# Patient Record
Sex: Male | Born: 1965 | Race: White | Hispanic: No | Marital: Married | Smoking: Current every day smoker
Health system: Southern US, Community
[De-identification: ages and names within clinical notes are randomized; demographics above are authoritative.]

## PROBLEM LIST (undated history)

## (undated) ENCOUNTER — Ambulatory Visit: Admission: EM

## (undated) DIAGNOSIS — F419 Anxiety disorder, unspecified: Secondary | ICD-10-CM

## (undated) DIAGNOSIS — G8929 Other chronic pain: Secondary | ICD-10-CM

## (undated) DIAGNOSIS — M549 Dorsalgia, unspecified: Secondary | ICD-10-CM

## (undated) DIAGNOSIS — K429 Umbilical hernia without obstruction or gangrene: Secondary | ICD-10-CM

## (undated) DIAGNOSIS — I10 Essential (primary) hypertension: Secondary | ICD-10-CM

## (undated) HISTORY — PX: HERNIA REPAIR: SHX51

## (undated) HISTORY — PX: BACK SURGERY: SHX140

---

## 2003-08-13 ENCOUNTER — Other Ambulatory Visit: Payer: Self-pay

## 2004-03-21 ENCOUNTER — Other Ambulatory Visit: Payer: Self-pay

## 2004-09-25 ENCOUNTER — Emergency Department: Payer: Self-pay | Admitting: Emergency Medicine

## 2004-10-07 ENCOUNTER — Emergency Department: Payer: Self-pay | Admitting: Emergency Medicine

## 2005-04-21 ENCOUNTER — Emergency Department: Payer: Self-pay | Admitting: Emergency Medicine

## 2005-04-21 ENCOUNTER — Other Ambulatory Visit: Payer: Self-pay

## 2005-09-02 ENCOUNTER — Other Ambulatory Visit: Payer: Self-pay

## 2005-09-02 ENCOUNTER — Emergency Department: Payer: Self-pay | Admitting: Emergency Medicine

## 2009-01-12 ENCOUNTER — Emergency Department: Payer: Self-pay

## 2009-01-17 ENCOUNTER — Ambulatory Visit: Payer: Self-pay | Admitting: Unknown Physician Specialty

## 2009-02-07 ENCOUNTER — Ambulatory Visit: Payer: Self-pay | Admitting: Unknown Physician Specialty

## 2009-11-09 ENCOUNTER — Ambulatory Visit: Payer: Self-pay | Admitting: Family Medicine

## 2010-01-29 ENCOUNTER — Ambulatory Visit: Payer: Self-pay | Admitting: Internal Medicine

## 2010-03-14 ENCOUNTER — Emergency Department: Payer: Self-pay | Admitting: Unknown Physician Specialty

## 2010-05-07 ENCOUNTER — Ambulatory Visit: Payer: Self-pay | Admitting: Internal Medicine

## 2012-02-11 ENCOUNTER — Ambulatory Visit: Payer: Self-pay | Admitting: Internal Medicine

## 2012-02-14 LAB — BETA STREP CULTURE(ARMC)

## 2012-05-01 ENCOUNTER — Emergency Department: Payer: Self-pay | Admitting: Emergency Medicine

## 2012-12-13 ENCOUNTER — Ambulatory Visit: Payer: Self-pay | Admitting: Physician Assistant

## 2013-04-07 ENCOUNTER — Other Ambulatory Visit: Payer: Self-pay | Admitting: Orthopedic Surgery

## 2013-04-07 DIAGNOSIS — M545 Low back pain: Secondary | ICD-10-CM

## 2013-04-14 ENCOUNTER — Other Ambulatory Visit: Payer: Self-pay

## 2013-04-14 ENCOUNTER — Ambulatory Visit
Admission: RE | Admit: 2013-04-14 | Discharge: 2013-04-14 | Disposition: A | Discharge status: Home / Self Care | Payer: 59 | Source: Ambulatory Visit | Attending: Orthopedic Surgery | Admitting: Orthopedic Surgery

## 2013-04-14 DIAGNOSIS — M545 Low back pain: Secondary | ICD-10-CM

## 2013-04-24 ENCOUNTER — Ambulatory Visit
Admission: RE | Admit: 2013-04-24 | Discharge: 2013-04-24 | Disposition: A | Discharge status: Home / Self Care | Payer: 59 | Source: Ambulatory Visit | Attending: Orthopedic Surgery | Admitting: Orthopedic Surgery

## 2013-05-02 ENCOUNTER — Emergency Department: Payer: Self-pay | Admitting: Emergency Medicine

## 2013-05-21 ENCOUNTER — Emergency Department: Payer: Self-pay | Admitting: Emergency Medicine

## 2013-05-21 LAB — COMPREHENSIVE METABOLIC PANEL
Albumin: 3.8 g/dL (ref 3.4–5.0)
Alkaline Phosphatase: 81 U/L (ref 50–136)
Anion Gap: 5 — ABNORMAL LOW (ref 7–16)
BUN: 10 mg/dL (ref 7–18)
Bilirubin,Total: 0.5 mg/dL (ref 0.2–1.0)
Calcium, Total: 9.2 mg/dL (ref 8.5–10.1)
Chloride: 105 mmol/L (ref 98–107)
Co2: 27 mmol/L (ref 21–32)
Creatinine: 0.94 mg/dL (ref 0.60–1.30)
EGFR (African American): >60
EGFR (Non-African Amer.): >60
Glucose: 100 mg/dL — ABNORMAL HIGH (ref 65–99)
Osmolality: 273 (ref 275–301)
Potassium: 4.3 mmol/L (ref 3.5–5.1)
SGOT(AST): 33 U/L (ref 15–37)
SGPT (ALT): 40 U/L (ref 12–78)
Sodium: 137 mmol/L (ref 136–145)
Total Protein: 7.6 g/dL (ref 6.4–8.2)

## 2013-05-21 LAB — CBC
MCH: 32.5 pg (ref 26.0–34.0)
MCHC: 34 g/dL (ref 32.0–36.0)
MCV: 96 fL (ref 80–100)
RBC: 4.76 10*6/uL (ref 4.40–5.90)
RDW: 13.3 % (ref 11.5–14.5)
WBC: 8.4 10*3/uL (ref 3.8–10.6)

## 2013-05-21 LAB — TROPONIN I
Troponin-I: <0.02 ng/mL
Troponin-I: <0.02 ng/mL

## 2013-05-31 ENCOUNTER — Ambulatory Visit: Payer: Self-pay | Admitting: Neurosurgery

## 2013-06-02 ENCOUNTER — Other Ambulatory Visit: Payer: Self-pay | Admitting: Neurosurgery

## 2013-07-07 ENCOUNTER — Encounter (HOSPITAL_COMMUNITY): Payer: Self-pay | Admitting: Pharmacy Technician

## 2013-07-09 ENCOUNTER — Encounter (HOSPITAL_COMMUNITY)
Admission: RE | Admit: 2013-07-09 | Discharge: 2013-07-09 | Disposition: A | Discharge status: Home / Self Care | Payer: 59 | Source: Ambulatory Visit | Attending: Neurosurgery | Admitting: Neurosurgery

## 2013-07-09 ENCOUNTER — Encounter (HOSPITAL_COMMUNITY): Payer: Self-pay

## 2013-07-09 ENCOUNTER — Other Ambulatory Visit (HOSPITAL_COMMUNITY): Payer: Self-pay | Admitting: *Deleted

## 2013-07-09 DIAGNOSIS — Z01818 Encounter for other preprocedural examination: Secondary | ICD-10-CM | POA: Insufficient documentation

## 2013-07-09 DIAGNOSIS — Z01812 Encounter for preprocedural laboratory examination: Secondary | ICD-10-CM | POA: Insufficient documentation

## 2013-07-09 HISTORY — DX: Umbilical hernia without obstruction or gangrene: K42.9

## 2013-07-09 HISTORY — DX: Essential (primary) hypertension: I10

## 2013-07-09 HISTORY — DX: Anxiety disorder, unspecified: F41.9

## 2013-07-09 LAB — CBC
HCT: 47.2 % (ref 39.0–52.0)
Hemoglobin: 15.9 g/dL (ref 13.0–17.0)
MCV: 95.7 fL (ref 78.0–100.0)
RBC: 4.93 MIL/uL (ref 4.22–5.81)
RDW: 12.9 % (ref 11.5–15.5)
WBC: 7.7 10*3/uL (ref 4.0–10.5)

## 2013-07-09 LAB — COMPREHENSIVE METABOLIC PANEL
Albumin: 3.7 g/dL (ref 3.5–5.2)
Alkaline Phosphatase: 74 U/L (ref 39–117)
BUN: 8 mg/dL (ref 6–23)
CO2: 26 mEq/L (ref 19–32)
Chloride: 103 mEq/L (ref 96–112)
GFR calc non Af Amer: >90 mL/min (ref >90)
Potassium: 4.5 mEq/L (ref 3.5–5.1)
Sodium: 139 mEq/L (ref 135–145)
Total Bilirubin: 0.2 mg/dL — ABNORMAL LOW (ref 0.3–1.2)

## 2013-07-09 LAB — SURGICAL PCR SCREEN: MRSA, PCR: NEGATIVE

## 2013-07-09 NOTE — Pre-Procedure Instructions (Signed)
Tristan Thompson [place]  07/09/2013   Your procedure is scheduled on:  Friday, July 16, 2013 at 7:30 AM.   Report to [facility] Entrance "A" at 5:30 AM.   Call this number if you have problems the morning of surgery: [phone]   Remember:   Do not eat food or drink liquids after midnight Thursday, 07/15/13.   Take these medicines the morning of surgery with A SIP OF WATER: pain med   Do not wear jewelry.  Do not wear lotions, powders, or cologne. You may wear deodorant.             Men may shave face and neck.  Do not bring valuables to the hospital.  [facility] is not responsible                  for any belongings or valuables.               Contacts, dentures or bridgework may not be worn into surgery.  Leave suitcase in the car. After surgery it may be brought to your room.  For patients admitted to the hospital, discharge time is determined by your                treatment team.                Special Instructions: Shower using CHG 2 nights before surgery and the night before surgery.  If you shower the day of surgery use CHG.  Use special wash - you have one bottle of CHG for all showers.  You should use approximately 1/3 of the bottle for each shower.   Please read over the following fact sheets that you were given: Pain Booklet, Coughing and Deep Breathing, MRSA Information and Surgical Site Infection Prevention

## 2013-07-15 MED ORDER — CEFAZOLIN SODIUM-DEXTROSE 2-3 GM-% IV SOLR
2.0000 g | INTRAVENOUS | Status: AC
  Administered 2013-07-16: 2 g via INTRAVENOUS
  Filled 2013-07-15: qty 50

## 2013-07-16 ENCOUNTER — Encounter (HOSPITAL_COMMUNITY)
Admission: RE | Disposition: A | Discharge status: Home / Self Care | Payer: Self-pay | Source: Ambulatory Visit | Attending: Neurosurgery

## 2013-07-16 ENCOUNTER — Encounter (HOSPITAL_COMMUNITY): Payer: 59 | Admitting: Anesthesiology

## 2013-07-16 ENCOUNTER — Inpatient Hospital Stay (HOSPITAL_COMMUNITY): Payer: 59 | Admitting: Anesthesiology

## 2013-07-16 ENCOUNTER — Encounter (HOSPITAL_COMMUNITY): Payer: Self-pay | Admitting: Surgery

## 2013-07-16 ENCOUNTER — Inpatient Hospital Stay (HOSPITAL_COMMUNITY)
Admission: RE | Admit: 2013-07-16 | Discharge: 2013-07-17 | DRG: 472 | Disposition: A | Discharge status: Home / Self Care | Payer: 59 | Source: Ambulatory Visit | Attending: Neurosurgery | Admitting: Neurosurgery

## 2013-07-16 ENCOUNTER — Inpatient Hospital Stay (HOSPITAL_COMMUNITY): Payer: 59

## 2013-07-16 DIAGNOSIS — Z79899 Other long term (current) drug therapy: Secondary | ICD-10-CM

## 2013-07-16 DIAGNOSIS — M5000 Cervical disc disorder with myelopathy, unspecified cervical region: Principal | ICD-10-CM | POA: Diagnosis present

## 2013-07-16 DIAGNOSIS — I10 Essential (primary) hypertension: Secondary | ICD-10-CM | POA: Diagnosis present

## 2013-07-16 DIAGNOSIS — M4712 Other spondylosis with myelopathy, cervical region: Secondary | ICD-10-CM | POA: Diagnosis present

## 2013-07-16 DIAGNOSIS — F411 Generalized anxiety disorder: Secondary | ICD-10-CM | POA: Diagnosis present

## 2013-07-16 DIAGNOSIS — F17200 Nicotine dependence, unspecified, uncomplicated: Secondary | ICD-10-CM | POA: Diagnosis present

## 2013-07-16 HISTORY — PX: ANTERIOR CERVICAL DECOMP/DISCECTOMY FUSION: SHX1161

## 2013-07-16 SURGERY — ANTERIOR CERVICAL DECOMPRESSION/DISCECTOMY FUSION 3 LEVELS
Anesthesia: General | Wound class: Clean

## 2013-07-16 MED ORDER — SODIUM CHLORIDE 0.9 % IJ SOLN
3.0000 mL | Freq: Two times a day (BID) | INTRAMUSCULAR | Status: DC
  Administered 2013-07-16 (×2): 3 mL via INTRAVENOUS

## 2013-07-16 MED ORDER — DEXAMETHASONE SODIUM PHOSPHATE 4 MG/ML IJ SOLN
4.0000 mg | Freq: Four times a day (QID) | INTRAMUSCULAR | Status: AC
  Administered 2013-07-16: 4 mg via INTRAVENOUS
  Filled 2013-07-16: qty 1

## 2013-07-16 MED ORDER — LIDOCAINE HCL (CARDIAC) 20 MG/ML IV SOLN
INTRAVENOUS | Status: DC | PRN
  Administered 2013-07-16: 100 mg via INTRAVENOUS

## 2013-07-16 MED ORDER — HYDROMORPHONE HCL PF 1 MG/ML IJ SOLN
INTRAMUSCULAR | Status: AC
  Filled 2013-07-16: qty 1

## 2013-07-16 MED ORDER — FENTANYL CITRATE 0.05 MG/ML IJ SOLN
INTRAMUSCULAR | Status: DC | PRN
  Administered 2013-07-16 (×3): 50 ug via INTRAVENOUS
  Administered 2013-07-16: 150 ug via INTRAVENOUS
  Administered 2013-07-16: 50 ug via INTRAVENOUS

## 2013-07-16 MED ORDER — ACETAMINOPHEN 325 MG PO TABS: 650.0000 mg | ORAL_TABLET | ORAL | Status: DC | PRN

## 2013-07-16 MED ORDER — MENTHOL 3 MG MT LOZG: 1.0000 | LOZENGE | OROMUCOSAL | Status: DC | PRN

## 2013-07-16 MED ORDER — CEFAZOLIN SODIUM-DEXTROSE 2-3 GM-% IV SOLR
2.0000 g | Freq: Three times a day (TID) | INTRAVENOUS | Status: AC
  Administered 2013-07-16 (×2): 2 g via INTRAVENOUS
  Filled 2013-07-16 (×2): qty 50

## 2013-07-16 MED ORDER — CYCLOBENZAPRINE HCL 10 MG PO TABS
ORAL_TABLET | ORAL | Status: AC
  Filled 2013-07-16: qty 1

## 2013-07-16 MED ORDER — OXYCODONE HCL 5 MG PO TABS
ORAL_TABLET | ORAL | Status: AC
  Filled 2013-07-16: qty 1

## 2013-07-16 MED ORDER — PANTOPRAZOLE SODIUM 40 MG IV SOLR
40.0000 mg | Freq: Every day | INTRAVENOUS | Status: DC
  Administered 2013-07-16: 40 mg via INTRAVENOUS
  Filled 2013-07-16 (×2): qty 40

## 2013-07-16 MED ORDER — ONDANSETRON HCL 4 MG/2ML IJ SOLN
INTRAMUSCULAR | Status: DC | PRN
  Administered 2013-07-16: 4 mg via INTRAVENOUS

## 2013-07-16 MED ORDER — HYDROMORPHONE HCL PF 1 MG/ML IJ SOLN
1.0000 mg | INTRAMUSCULAR | Status: DC | PRN
  Administered 2013-07-16: 1.5 mg via INTRAMUSCULAR
  Filled 2013-07-16: qty 2

## 2013-07-16 MED ORDER — SODIUM CHLORIDE 0.9 % IR SOLN
Status: DC | PRN
  Administered 2013-07-16: 08:00:00

## 2013-07-16 MED ORDER — OXYCODONE HCL 5 MG PO TABS
5.0000 mg | ORAL_TABLET | Freq: Once | ORAL | Status: AC | PRN
Start: 2013-07-16 — End: 2013-07-16
  Administered 2013-07-16: 5 mg via ORAL

## 2013-07-16 MED ORDER — ROCURONIUM BROMIDE 100 MG/10ML IV SOLN
INTRAVENOUS | Status: DC | PRN
  Administered 2013-07-16: 20 mg via INTRAVENOUS
  Administered 2013-07-16: 50 mg via INTRAVENOUS
  Administered 2013-07-16: 10 mg via INTRAVENOUS
  Administered 2013-07-16 (×2): 20 mg via INTRAVENOUS

## 2013-07-16 MED ORDER — ACETAMINOPHEN 650 MG RE SUPP: 650.0000 mg | RECTAL | Status: DC | PRN

## 2013-07-16 MED ORDER — SODIUM CHLORIDE 0.9 % IJ SOLN: 3.0000 mL | INTRAMUSCULAR | Status: DC | PRN

## 2013-07-16 MED ORDER — PHENOL 1.4 % MT LIQD: 1.0000 | OROMUCOSAL | Status: DC | PRN

## 2013-07-16 MED ORDER — HYDROCODONE-ACETAMINOPHEN 5-325 MG PO TABS
1.0000 | ORAL_TABLET | ORAL | Status: DC | PRN
  Administered 2013-07-16 – 2013-07-17 (×4): 2 via ORAL
  Filled 2013-07-16 (×4): qty 2

## 2013-07-16 MED ORDER — CYCLOBENZAPRINE HCL 10 MG PO TABS
10.0000 mg | ORAL_TABLET | Freq: Three times a day (TID) | ORAL | Status: DC | PRN
  Administered 2013-07-16 – 2013-07-17 (×3): 10 mg via ORAL
  Filled 2013-07-16 (×3): qty 1

## 2013-07-16 MED ORDER — KCL IN DEXTROSE-NACL 20-5-0.45 MEQ/L-%-% IV SOLN
80.0000 mL/h | INTRAVENOUS | Status: DC
  Filled 2013-07-16 (×3): qty 1000

## 2013-07-16 MED ORDER — OXYCODONE HCL 5 MG/5ML PO SOLN: 5.0000 mg | Freq: Once | ORAL | Status: AC | PRN

## 2013-07-16 MED ORDER — LACTATED RINGERS IV SOLN
INTRAVENOUS | Status: DC | PRN
  Administered 2013-07-16 (×3): via INTRAVENOUS

## 2013-07-16 MED ORDER — 0.9 % SODIUM CHLORIDE (POUR BTL) OPTIME
TOPICAL | Status: DC | PRN
  Administered 2013-07-16: 1000 mL

## 2013-07-16 MED ORDER — HYDROMORPHONE HCL PF 1 MG/ML IJ SOLN
0.2500 mg | INTRAMUSCULAR | Status: DC | PRN
  Administered 2013-07-16 (×4): 0.5 mg via INTRAVENOUS

## 2013-07-16 MED ORDER — CEFAZOLIN SODIUM-DEXTROSE 2-3 GM-% IV SOLR
INTRAVENOUS | Status: AC
  Filled 2013-07-16: qty 50

## 2013-07-16 MED ORDER — METOCLOPRAMIDE HCL 5 MG/ML IJ SOLN: 10.0000 mg | Freq: Once | INTRAMUSCULAR | Status: DC | PRN

## 2013-07-16 MED ORDER — GLYCOPYRROLATE 0.2 MG/ML IJ SOLN
INTRAMUSCULAR | Status: DC | PRN
  Administered 2013-07-16: .8 mg via INTRAVENOUS

## 2013-07-16 MED ORDER — NEOSTIGMINE METHYLSULFATE 1 MG/ML IJ SOLN
INTRAMUSCULAR | Status: DC | PRN
  Administered 2013-07-16: 4 mg via INTRAVENOUS

## 2013-07-16 MED ORDER — ONDANSETRON HCL 4 MG/2ML IJ SOLN: 4.0000 mg | INTRAMUSCULAR | Status: DC | PRN

## 2013-07-16 MED ORDER — PROPOFOL 10 MG/ML IV BOLUS
INTRAVENOUS | Status: DC | PRN
  Administered 2013-07-16: 200 mg via INTRAVENOUS

## 2013-07-16 MED ORDER — MIDAZOLAM HCL 5 MG/5ML IJ SOLN
INTRAMUSCULAR | Status: DC | PRN
  Administered 2013-07-16: 2 mg via INTRAVENOUS

## 2013-07-16 MED ORDER — ARTIFICIAL TEARS OP OINT
TOPICAL_OINTMENT | OPHTHALMIC | Status: DC | PRN
  Administered 2013-07-16: 1 via OPHTHALMIC

## 2013-07-16 MED ORDER — DEXAMETHASONE 4 MG PO TABS
4.0000 mg | ORAL_TABLET | Freq: Four times a day (QID) | ORAL | Status: AC
  Administered 2013-07-16: 4 mg via ORAL
  Filled 2013-07-16: qty 1

## 2013-07-16 MED ORDER — THROMBIN 20000 UNITS EX SOLR
CUTANEOUS | Status: DC | PRN
  Administered 2013-07-16: 08:00:00 via TOPICAL

## 2013-07-16 MED ORDER — ALBUMIN HUMAN 5 % IV SOLN
INTRAVENOUS | Status: DC | PRN
  Administered 2013-07-16: 10:00:00 via INTRAVENOUS

## 2013-07-16 SURGICAL SUPPLY — 61 items
BAG DECANTER FOR FLEXI CONT (MISCELLANEOUS) ×2 IMPLANT
BENZOIN TINCTURE PRP APPL 2/3 (GAUZE/BANDAGES/DRESSINGS) ×2 IMPLANT
BIT DRILL TRINICA 2.3MM (BIT) ×1 IMPLANT
BRUSH SCRUB EZ PLAIN DRY (MISCELLANEOUS) ×2 IMPLANT
BUR MATCHSTICK NEURO 3.0 LAGG (BURR) ×2 IMPLANT
CANISTER SUCT 3000ML (MISCELLANEOUS) ×2 IMPLANT
CONT SPEC 4OZ CLIKSEAL STRL BL (MISCELLANEOUS) ×2 IMPLANT
DRAIN JACKSON PRATT 1/4 1325 (MISCELLANEOUS) ×2 IMPLANT
DRAPE C-ARM 42X72 X-RAY (DRAPES) ×4 IMPLANT
DRAPE LAPAROTOMY 100X72 PEDS (DRAPES) ×2 IMPLANT
DRAPE MICROSCOPE LEICA (MISCELLANEOUS) ×2 IMPLANT
DRAPE MICROSCOPE ZEISS OPMI (DRAPES) IMPLANT
DRAPE POUCH INSTRU U-SHP 10X18 (DRAPES) ×2 IMPLANT
DRAPE SURG 17X23 STRL (DRAPES) ×4 IMPLANT
DRESSING TELFA 8X3 (GAUZE/BANDAGES/DRESSINGS) ×2 IMPLANT
DRILL BIT TRINICA 2.3MM (BIT) ×2
DRSG OPSITE POSTOP 4X6 (GAUZE/BANDAGES/DRESSINGS) ×2 IMPLANT
DURAPREP 6ML APPLICATOR 50/CS (WOUND CARE) ×2 IMPLANT
ELECT COATED BLADE 2.86 ST (ELECTRODE) ×2 IMPLANT
ELECT REM PT RETURN 9FT ADLT (ELECTROSURGICAL) ×2
ELECTRODE REM PT RTRN 9FT ADLT (ELECTROSURGICAL) ×1 IMPLANT
EVACUATOR SILICONE 100CC (DRAIN) ×2 IMPLANT
GAUZE SPONGE 4X4 16PLY XRAY LF (GAUZE/BANDAGES/DRESSINGS) ×2 IMPLANT
GLOVE ECLIPSE 7.0 STRL STRAW (GLOVE) ×8 IMPLANT
GLOVE ECLIPSE 8.0 STRL XLNG CF (GLOVE) ×2 IMPLANT
GLOVE EXAM NITRILE LRG STRL (GLOVE) ×4 IMPLANT
GLOVE EXAM NITRILE XL STR (GLOVE) IMPLANT
GLOVE EXAM NITRILE XS STR PU (GLOVE) IMPLANT
GLOVE INDICATOR 7.5 STRL GRN (GLOVE) ×2 IMPLANT
GOWN BRE IMP SLV AUR LG STRL (GOWN DISPOSABLE) ×4 IMPLANT
GOWN BRE IMP SLV AUR XL STRL (GOWN DISPOSABLE) ×2 IMPLANT
GOWN STRL REIN 2XL LVL4 (GOWN DISPOSABLE) IMPLANT
HEAD HALTER (SOFTGOODS) ×2 IMPLANT
KIT BASIN OR (CUSTOM PROCEDURE TRAY) ×2 IMPLANT
KIT ROOM TURNOVER OR (KITS) ×2 IMPLANT
NEEDLE SPNL 20GX3.5 QUINCKE YW (NEEDLE) ×2 IMPLANT
NS IRRIG 1000ML POUR BTL (IV SOLUTION) ×2 IMPLANT
PACK LAMINECTOMY NEURO (CUSTOM PROCEDURE TRAY) ×2 IMPLANT
PAD ARMBOARD 7.5X6 YLW CONV (MISCELLANEOUS) ×2 IMPLANT
PATTIES SURGICAL .25X.25 (GAUZE/BANDAGES/DRESSINGS) IMPLANT
PATTIES SURGICAL .75X.75 (GAUZE/BANDAGES/DRESSINGS) ×2 IMPLANT
PLATE 24MM (Plate) ×2 IMPLANT
PLATE 40MM (Plate) ×2 IMPLANT
PUTTY BONE GRAFT KIT 2.5ML (Bone Implant) ×2 IMPLANT
RUBBERBAND STERILE (MISCELLANEOUS) ×4 IMPLANT
SCREW SELF DRILL FIXED 14MM (Screw) ×16 IMPLANT
SCREW SELF DRILL VAR 14MMX4.2 (Screw) ×4 IMPLANT
SPACER TM-S 11X14X5-7 (Spacer) ×2 IMPLANT
SPACER TMS 11X14X6MM (Spacer) ×4 IMPLANT
SPONGE GAUZE 4X4 12PLY (GAUZE/BANDAGES/DRESSINGS) ×2 IMPLANT
SPONGE INTESTINAL PEANUT (DISPOSABLE) ×2 IMPLANT
SPONGE SURGIFOAM ABS GEL 100 (HEMOSTASIS) ×2 IMPLANT
STRIP CLOSURE SKIN 1/2X4 (GAUZE/BANDAGES/DRESSINGS) ×2 IMPLANT
SUT PDS AB 5-0 P3 18 (SUTURE) ×2 IMPLANT
SUT VIC AB 3-0 CP2 18 (SUTURE) ×2 IMPLANT
SYR 20ML ECCENTRIC (SYRINGE) IMPLANT
TOWEL OR 17X24 6PK STRL BLUE (TOWEL DISPOSABLE) ×2 IMPLANT
TOWEL OR 17X26 10 PK STRL BLUE (TOWEL DISPOSABLE) ×2 IMPLANT
TRAP SPECIMEN MUCOUS 40CC (MISCELLANEOUS) ×2 IMPLANT
TRAY FOLEY CATH 16FRSI W/METER (SET/KITS/TRAYS/PACK) ×2 IMPLANT
WATER STERILE IRR 1000ML POUR (IV SOLUTION) ×2 IMPLANT

## 2013-07-16 NOTE — Anesthesia Procedure Notes (Signed)
Procedure Name: Intubation Date/Time: 07/16/2013 7:50 AM Performed by: Carmela Rima Pre-anesthesia Checklist: Patient identified, Timeout performed, Emergency Drugs available, Suction available and Patient being monitored Patient Re-evaluated:Patient Re-evaluated prior to inductionOxygen Delivery Method: Circle system utilized Preoxygenation: Pre-oxygenation with 100% oxygen Intubation Type: IV induction Ventilation: Mask ventilation without difficulty Laryngoscope Size: Mac and 4 Grade View: Grade II Tube type: Oral Tube size: 7.5 mm Number of attempts: 1 Placement Confirmation: positive ETCO2,  ETT inserted through vocal cords under direct vision and breath sounds checked- equal and bilateral Secured at: 23 cm Tube secured with: Tape Dental Injury: Teeth and Oropharynx as per pre-operative assessment

## 2013-07-16 NOTE — Anesthesia Preprocedure Evaluation (Addendum)
Anesthesia Evaluation  Patient identified by MRN, date of birth, ID band Patient awake    Reviewed: Allergy & Precautions, H&P , NPO status , Patient's Chart, lab work & pertinent test results, reviewed documented beta blocker date and time   Airway Mallampati: II TM Distance: >3 FB Neck ROM: full    Dental  (+) Dental Advidsory Given and Teeth Intact   Pulmonary Current Smoker,  breath sounds clear to auscultation        Cardiovascular hypertension, Rhythm:regular     Neuro/Psych PSYCHIATRIC DISORDERS Anxiety negative neurological ROS     GI/Hepatic negative GI ROS, Neg liver ROS,   Endo/Other  negative endocrine ROS  Renal/GU negative Renal ROS  negative genitourinary   Musculoskeletal   Abdominal   Peds  Hematology negative hematology ROS (+)   Anesthesia Other Findings See surgeon's H&P   Reproductive/Obstetrics negative OB ROS                         Anesthesia Physical Anesthesia Plan  ASA: II  Anesthesia Plan: General   Post-op Pain Management:    Induction: Intravenous  Airway Management Planned: Oral ETT  Additional Equipment:   Intra-op Plan:   Post-operative Plan: Extubation in OR  Informed Consent: I have reviewed the patients History and Physical, chart, labs and discussed the procedure including the risks, benefits and alternatives for the proposed anesthesia with the patient or authorized representative who has indicated his/her understanding and acceptance.   Dental Advisory Given  Plan Discussed with: Anesthesiologist, CRNA and Surgeon  Anesthesia Plan Comments:        Anesthesia Quick Evaluation

## 2013-07-16 NOTE — Preoperative (Signed)
Beta Blockers   Reason not to administer Beta Blockers:Not Applicable 

## 2013-07-16 NOTE — Progress Notes (Signed)
Utilization review completed. Wallis Vancott, RN, BSN. 

## 2013-07-16 NOTE — Plan of Care (Signed)
Problem: Consults Goal: Diagnosis - Spinal Surgery Outcome: Completed/Met Date Met:  07/16/13 Cervical Spine Fusion     

## 2013-07-16 NOTE — H&P (Signed)
  Tristan Thompson is an 47 y.o. male.   Chief Complaint: Numbness and tingling of the upper extremities HPI: The patient is a 47 year old gentleman who presented with a history of numbness and tingling in the upper extremities as well as the lower extremity pain. He be evaluated with an MRI scan of the lumbar spine which showed a disc abnormality at L5-S1. On his exam however he had marked signs of myelopathy was elected to evaluate things higher. Thoracic spine looks normal but his cervical spine showed marked stenosis at the C3-4 C4-5 and C6-7 with some high signal in the spinal cord. After discussing the options and discussing the risks of treatment versus nontreatment was elected to bring the patient to the operating room and do a decompressive anterior cervical discectomy C3-4 C4-5 and C6-7. I've had a long discussion with him regarding the risks and benefits of surgical intervention. The risks discussed include but are not limited to bleeding infection weakness and paralysis spinal fluid leak trouble with instrumentation nonunion coma hoarseness and death. We have discussed alternative methods of therapy offered risks and benefits of nonintervention. He's had the opportunity to ask numerous questions and appears to understand. With this information in hand he has requested we proceed with surgery.  Past Medical History  Diagnosis Date  . Anxiety   . Hypertension     has a prescription but has not had it filled.  Marland Kitchen Umbilical hernia     has had surgery to "patch" it    Past Surgical History  Procedure Laterality Date  . Hernia repair      History reviewed. No pertinent family history. Social History:  reports that he has been smoking Cigarettes.  He has been smoking about 0.00 packs per day. He has never used smokeless tobacco. He reports that he drinks alcohol. He reports that he does not use illicit drugs.  Allergies: No Known Allergies  Medications Prior to Admission  Medication Sig  Dispense Refill  . b complex vitamins tablet Take 1 tablet by mouth daily.      Marland Kitchen HYDROcodone-acetaminophen (NORCO) 10-325 MG per tablet Take 1 tablet by mouth daily as needed for pain.        No results found for this or any previous visit (from the past 48 hour(s)). No results found.  Positive for the numbness and tingling in the hands and anxiety disorder  Blood pressure 144/98, pulse 70, temperature 98 F (36.7 C), temperature source Oral, resp. rate 20, SpO2 100.00%.  Patient is awake or and oriented. His no facial asymmetry. Skin is mildly spastic. His reflexes are increased has some clonus and Hoffmann's reflexes. Strength however is 5 over 5 Assessment/Plan Impression is that of cervical myelopathy at C3-4 C4-5 and C6-7. The plan is for 3 levels of anterior cervical discectomy with fusion and plating.  Reinaldo Meeker, MD 07/16/2013, 7:41 AM

## 2013-07-16 NOTE — Anesthesia Postprocedure Evaluation (Signed)
Anesthesia Post Note  Patient: Tristan Thompson  Procedure(s) Performed: Procedure(s) (LRB): ANTERIOR CERVICAL DECOMPRESSION/DISCECTOMY FUSION CERVICAL THREE-FOUR,FOUR-FIVE AND CERVICAL SIX-SEVEN (N/A)  Anesthesia type: General  Patient location: PACU  Post pain: Pain level controlled  Post assessment: Patient's Cardiovascular Status Stable  Last Vitals:  Filed Vitals:   07/16/13 1230  BP: 146/83  Pulse: 86  Temp:   Resp: 18    Post vital signs: Reviewed and stable  Level of consciousness: alert  Complications: No apparent anesthesia complications

## 2013-07-16 NOTE — Op Note (Signed)
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 Localizing fluoroscopy was used prior to incision to identify the appropriate level. Vertical incision was made in the right neck: On the border between the sternocleidomastoid laterally and the strap muscles medially. Platysma muscle was incised in a vertical fashion and the natural fascial plane between the strap muscles and sternal cleidomastoid muscle was identified and followed down to the anterior aspect the cervical spine. Longus Cole muscles were identified split the midline to play bilaterally with Kitner dissector and unipolar coagulation until we had exposed C3-C4 C5-C6 and C7. We incised the disc at all 3 levels. We placed a retractor and C6-7 I and proceeded to do the anterior cervical discectomy at this level. We used high-speed drill the was to widen the interspace as a bony shavings for use later in the case. We dissected all the way down to the posterior longitudinal ligament was then incised the cut edges removed a Kerrison punch. Thorough decompression was carried out on the spinal dura to the proximal foramen could be identified bilaterally. We then did a measurements chose an appropriately length trabecular metal graft with a mixture of autologous bone. We confirmed hemostasis then impacted without difficulty. Fossae showed to be in good position. An appropriately length Trinica H. her cervical plate was then chosen. Under fluoroscopic guidance drill holes were placed followed by placing of 14 numerous screws x4. Locking mechanism was rotated locked position.  Fossae showed good position of the instrumentation. We then removed our retractors to the upper levels and proceeded to enter cervical discectomies at C3-4 and C4-5. We used high-speed drill to widen the interspace and bony shavings removed the disc material down the posterior longitudinal ligament. We then used the microscope to decompress C3-4 the posterior longitudinal ligament a bony disc overgrowth that was compressing the spinal dura. We did a generous decompression to the proximal foramen bilaterally. We then did a similar decompression C4-5 which was markedly compressing the spinal dura. We were done however excellent decompression of the spinal dura and proximal foramen bilaterally. We then chose appropriate length trabecular metal graft total mixture togs bone morselized allograft. We irrigated copiously controlled any bleeding but coagulation Gelfoam. We packed a postop difficulty and fossae showed to be in good position. An appropriately length Trinica intracervical plate was then chosen. Under fluoroscopic guidance drill holes were placed followed by placing a 14 mm screws x6. Locking mechanism was rotated locked position for sleep at the end showed good position of the levels of the plates screws and plugs. Irrigation was carried out and any bleeding control proper coagulation Gelfoam. A prevertebral drain was brought out through separate separate incision incision the left the prevertebral space. The was then closed with inverted Vicryl on the does muscle and subcuticular layer and Steri-Strips were placed on the skin. Shortness and soft collar applied and the patient was extubated and taken to cut them in stable condition.

## 2013-07-16 NOTE — Transfer of Care (Signed)
Immediate Anesthesia Transfer of Care Note  Patient: Tristan Thompson Asman  Procedure(s) Performed: Procedure(s): ANTERIOR CERVICAL DECOMPRESSION/DISCECTOMY FUSION CERVICAL THREE-FOUR,FOUR-FIVE AND CERVICAL SIX-SEVEN (N/A)  Patient Location: PACU  Anesthesia Type:General  Level of Consciousness: awake, alert  and oriented  Airway & Oxygen Therapy: Patient Spontanous Breathing and Patient connected to face mask oxygen  Post-op Assessment: Report given to PACU RN, Post -op Vital signs reviewed and stable and Patient moving all extremities X 4  Post vital signs: Reviewed and stable  Complications: No apparent anesthesia complications

## 2013-07-17 ENCOUNTER — Emergency Department: Payer: Self-pay | Admitting: Emergency Medicine

## 2013-07-17 LAB — BASIC METABOLIC PANEL
Anion Gap: 0 — ABNORMAL LOW (ref 7–16)
Chloride: 104 mmol/L (ref 98–107)
Co2: 33 mmol/L — ABNORMAL HIGH (ref 21–32)
Creatinine: 1.09 mg/dL (ref 0.60–1.30)
EGFR (African American): >60
Osmolality: 274 (ref 275–301)
Potassium: 3.8 mmol/L (ref 3.5–5.1)

## 2013-07-17 LAB — CBC
HCT: 43.4 % (ref 40.0–52.0)
MCH: 32.6 pg (ref 26.0–34.0)
MCV: 96 fL (ref 80–100)
Platelet: 140 10*3/uL — ABNORMAL LOW (ref 150–440)

## 2013-07-17 MED ORDER — HYDROCODONE-ACETAMINOPHEN 5-325 MG PO TABS: 1.0000 | ORAL_TABLET | ORAL | Status: DC | PRN

## 2013-07-17 MED ORDER — CYCLOBENZAPRINE HCL 10 MG PO TABS: 10.0000 mg | ORAL_TABLET | Freq: Three times a day (TID) | ORAL | Status: DC | PRN

## 2013-07-17 NOTE — Progress Notes (Signed)
Pt. Alert and oriented, follows simple instructions, denies pain. Incision area without swelling, redness or S/S of infection. Voiding adequate clear yellow urine. Moving all extremities well and vitals stable and documented. Patient discharged home with spouse. Anterior Cervical Fusion surgery notes instructions given to patient and family member for home safety and precautions. Pt. and family stated understanding of instructions given. Pain meds given per Pt.'s request for pain and discomfort of ride home 

## 2013-07-17 NOTE — Progress Notes (Signed)
Patient ID: Tristan Thompson, male   DOB: Feb 15, 1966, 47 y.o.   MRN: [id number] Doing well significant room and arm pain swallowing is manageable wound is clean and dry discharge home

## 2013-07-17 NOTE — Discharge Summary (Signed)
  Physician Discharge Summary  Patient ID: Tristan Thompson MRN: [id number] DOB/AGE: 1966-05-01 47 y.o.  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 *   Discharged Condition: good  Hospital Course: Patient is been hospital underwent anterior cervical discectomy and fusion at the aforementioned levels postoperatively patient did very well with recovered in the floor on the floor she was ambulating and voiding spontaneously tolerating regular diet and was stable to be discharged home.  Consults: Significant Diagnostic Studies: Treatments: ACDF C3-4, C4-5, C5-6 the Discharge Exam: Blood pressure 117/75, pulse 61, temperature 98.7 F (37.1 C), temperature source Oral, resp. rate 16, SpO2 94.00%. Strength out of 5 wound clean and dry  Disposition: Home     Medication List         b complex vitamins tablet  Take 1 tablet by mouth daily.     cyclobenzaprine 10 MG tablet  Commonly known as:  FLEXERIL  Take 1 tablet (10 mg total) by mouth 3 (three) times daily as needed for muscle spasms.     HYDROcodone-acetaminophen 5-325 MG per tablet  Commonly known as:  NORCO/VICODIN  Take 1-2 tablets by mouth every 4 (four) hours as needed for moderate pain.     HYDROcodone-acetaminophen 10-325 MG per tablet  Commonly known as:  NORCO  Take 1 tablet by mouth daily as needed for pain.           Follow-up Information   Follow up with Reinaldo Meeker, MD.   Specialty:  Neurosurgery   Contact information:   [address] [place] [address] [phone]       Signed: Antwain Caliendo P 07/17/2013, 8:10 AM

## 2013-07-19 ENCOUNTER — Encounter (HOSPITAL_COMMUNITY): Payer: Self-pay | Admitting: Neurosurgery

## 2014-05-17 ENCOUNTER — Emergency Department: Payer: Self-pay | Admitting: Emergency Medicine

## 2016-04-09 ENCOUNTER — Ambulatory Visit
Admission: EM | Admit: 2016-04-09 | Discharge: 2016-04-09 | Disposition: A | Discharge status: Home / Self Care | Payer: 59 | Attending: Family Medicine | Admitting: Family Medicine

## 2016-04-09 DIAGNOSIS — T2220XA Burn of second degree of shoulder and upper limb, except wrist and hand, unspecified site, initial encounter: Secondary | ICD-10-CM

## 2016-04-09 DIAGNOSIS — T2020XA Burn of second degree of head, face, and neck, unspecified site, initial encounter: Secondary | ICD-10-CM

## 2016-04-09 MED ORDER — TETANUS-DIPHTH-ACELL PERTUSSIS 5-2.5-18.5 LF-MCG/0.5 IM SUSP
0.5000 mL | Freq: Once | INTRAMUSCULAR | Status: AC
  Administered 2016-04-09: 0.5 mL via INTRAMUSCULAR

## 2016-04-09 MED ORDER — SILVER SULFADIAZINE 1 % EX CREA: 1.0000 "application " | TOPICAL_CREAM | Freq: Every day | CUTANEOUS | 0 refills | Status: DC

## 2016-04-09 MED ORDER — SILVER SULFADIAZINE 1 % EX CREA
TOPICAL_CREAM | Freq: Every day | CUTANEOUS | Status: DC
  Administered 2016-04-09: 1 via TOPICAL
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
## 2016-12-22 ENCOUNTER — Encounter: Payer: Self-pay | Admitting: Gynecology

## 2016-12-22 ENCOUNTER — Ambulatory Visit (INDEPENDENT_AMBULATORY_CARE_PROVIDER_SITE_OTHER): Payer: 59

## 2016-12-22 ENCOUNTER — Ambulatory Visit
Admission: EM | Admit: 2016-12-22 | Discharge: 2016-12-22 | Disposition: A | Discharge status: Home / Self Care | Payer: 59 | Attending: Family Medicine | Admitting: Family Medicine

## 2016-12-22 DIAGNOSIS — M5432 Sciatica, left side: Secondary | ICD-10-CM

## 2016-12-22 DIAGNOSIS — M545 Low back pain: Secondary | ICD-10-CM | POA: Diagnosis not present

## 2016-12-22 DIAGNOSIS — M62830 Muscle spasm of back: Secondary | ICD-10-CM

## 2016-12-22 DIAGNOSIS — M4324 Fusion of spine, thoracic region: Secondary | ICD-10-CM | POA: Diagnosis not present

## 2016-12-22 DIAGNOSIS — M5431 Sciatica, right side: Secondary | ICD-10-CM | POA: Diagnosis not present

## 2016-12-22 DIAGNOSIS — M5440 Lumbago with sciatica, unspecified side: Secondary | ICD-10-CM

## 2016-12-22 DIAGNOSIS — T148XXA Other injury of unspecified body region, initial encounter: Secondary | ICD-10-CM

## 2016-12-22 MED ORDER — KETOROLAC TROMETHAMINE 60 MG/2ML IM SOLN: 60.0000 mg | Freq: Once | INTRAMUSCULAR | Status: DC

## 2016-12-22 MED ORDER — ORPHENADRINE CITRATE ER 100 MG PO TB12: 100.0000 mg | ORAL_TABLET | Freq: Two times a day (BID) | ORAL | 0 refills | Status: DC

## 2016-12-22 MED ORDER — PREDNISONE 10 MG (21) PO TBPK: ORAL_TABLET | ORAL | 0 refills | Status: DC

## 2016-12-22 MED ORDER — MELOXICAM 15 MG PO TABS: 15.0000 mg | ORAL_TABLET | Freq: Every day | ORAL | 0 refills | Status: DC

## 2016-12-22 MED ORDER — KETOROLAC TROMETHAMINE 60 MG/2ML IM SOLN
60.0000 mg | Freq: Once | INTRAMUSCULAR | Status: AC
  Administered 2016-12-22: 60 mg via INTRAMUSCULAR

## 2016-12-22 NOTE — Discharge Instructions (Signed)
Stop smoking

## 2016-12-22 NOTE — ED Triage Notes (Signed)
Per patient chronic back for many years. Patient stated back pain getting worse every day.

## 2016-12-22 NOTE — ED Provider Notes (Signed)
[facility]    CSN: [id number] Arrival date & time: 12/22/16  1141     History   Chief Complaint Chief Complaint  Patient presents with  . Back Pain    HPI Tristan Thompson is a 51 y.o. male.   Patient's here because of upper and lower back pain. He is a 51 year old white male who's had chronic back pain. He is on a medication for his back he's had acupuncture treatment and is gone to [facility] before in the past. He's had a history of recurrent trouble with his back states that he may have done too much work today or yesterday doing the lawn. Reports didn't go to sleep last night because the pain and we went to work and get worse. 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 He thinks he has authorized the back. He denies any acute trauma to his back. Unfortunately does smoke. He does have a history of anxiety and hypertension and umbilical hernia but apparently he does not take his medicine for his hypertension. No known drug allergies. No pertinent family medical history relevant to today's visit.   The history is provided by the patient. No language interpreter was used.  Back Pain  Location:  Thoracic spine and lumbar spine Quality:  Aching and cramping Radiates to:  Does not radiate Pain severity:  Moderate Pain is:  Same all the time Onset quality:  Sudden Timing:  Constant Progression:  Worsening Chronicity:  New Context: lifting heavy objects and twisting   Context: not recent injury   Relieved by:  Nothing Worsened by:  Movement Ineffective treatments:  None tried Associated symptoms: leg pain and numbness     Past Medical History:  Diagnosis Date  . Anxiety   . Hypertension    has a prescription but has not had it filled.  Marland Kitchen Umbilical hernia    has had surgery to "patch" it    There are no active problems to display for this patient.   Past Surgical History:  Procedure Laterality Date  . ANTERIOR CERVICAL  DECOMP/DISCECTOMY FUSION N/A 07/16/2013   Procedure: ANTERIOR CERVICAL DECOMPRESSION/DISCECTOMY FUSION CERVICAL THREE-FOUR,FOUR-FIVE AND CERVICAL SIX-SEVEN;  Surgeon: Reinaldo Meeker, MD;  Location: [facility];  Service: Neurosurgery;  Laterality: N/A;  . HERNIA REPAIR         Home Medications    Prior to Admission medications   Medication Sig Start Date End Date Taking? Authorizing Provider  b complex vitamins tablet Take 1 tablet by mouth daily.    Historical Provider, MD  meloxicam (MOBIC) 15 MG tablet Take 1 tablet (15 mg total) by mouth daily. 12/22/16   Hassan Rowan, MD  orphenadrine (NORFLEX) 100 MG tablet Take 1 tablet (100 mg total) by mouth 2 (two) times daily. 12/22/16   Hassan Rowan, MD  predniSONE (STERAPRED UNI-PAK 21 TAB) 10 MG (21) TBPK tablet 6 tabs day 1 and 2, 5 tabs day 3 and 4, 4 tabs day 5 and 6, 3 tabs day 7 and 8, 2 tabs day 9 and 10, 1 tab day 11 and 12. Take orally 12/22/16   Hassan Rowan, MD  silver sulfADIAZINE (SILVADENE) 1 % cream Apply 1 application topically daily. 04/09/16   Lutricia Feil, PA-C    Family History No family history on file.  Social History Social History  Substance Use Topics  . Smoking status: Current Every Day Smoker    Types: Cigarettes  . Smokeless tobacco: Never Used  .  Alcohol use Yes     Comment: six pack of beer/day     Allergies   Patient has no known allergies.   Review of Systems Review of Systems  Musculoskeletal: Positive for back pain.  Neurological: Positive for numbness.  All other systems reviewed and are negative.    Physical Exam Triage Vital Signs ED Triage Vitals  Enc Vitals Group     BP 12/22/16 1154 (!) 154/82     Pulse Rate 12/22/16 1154 69     Resp 12/22/16 1154 16     Temp 12/22/16 1154 98.3 F (36.8 C)     Temp Source 12/22/16 1154 Oral     SpO2 12/22/16 1154 98 %     Weight 12/22/16 1155 210 lb (95.3 kg)     Height 12/22/16 1155  (1.88 m)     Head Circumference --      Peak Flow --       Pain Score 12/22/16 1156 9     Pain Loc --      Pain Edu? --      Excl. in GC? --    No data found.   Updated Vital Signs BP (!) 154/82 (BP Location: Left Arm)   Pulse 69   Temp 98.3 F (36.8 C) (Oral)   Resp 16   Ht  (1.88 m)   Wt 210 lb (95.3 kg)   SpO2 98%   BMI 26.96 kg/m   Visual Acuity Right Eye Distance:   Left Eye Distance:   Bilateral Distance:    Right Eye Near:   Left Eye Near:    Bilateral Near:     Physical Exam  Constitutional: He is oriented to person, place, and time. He appears well-developed and well-nourished.  HENT:  Head: Normocephalic and atraumatic.  Right Ear: External ear normal.  Left Ear: External ear normal.  Eyes: EOM are normal. Pupils are equal, round, and reactive to light.  Neck: Normal range of motion.  Pulmonary/Chest: Effort normal.  Musculoskeletal: He exhibits tenderness.  Neurological: He is alert and oriented to person, place, and time. No cranial nerve deficit. Coordination normal.  Skin: Skin is warm.  Psychiatric: He has a normal mood and affect. His behavior is normal.  Vitals reviewed.    UC Treatments / Results  Labs (all labs ordered are listed, but only abnormal results are displayed) Labs Reviewed - No data to display  EKG  EKG Interpretation None       Radiology Dg Thoracic Spine 2 View  Result Date: 12/22/2016 CLINICAL DATA:  Thoracic and lumbar spine pain extending into the lower extremities, right greater than left. EXAM: THORACIC SPINE 2 VIEWS COMPARISON:  MRI report 05/31/2013.  Images are no longer available. FINDINGS: Thirteen rib-bearing thoracic type vertebral bodies are present. Partial fusion at T6-7 was previously labeled T7-8. There is focal kyphosis at the T6-7 level. Vertebral body heights and alignment are otherwise maintained. No acute fracture traumatic subluxation is evident. The visualized lung fields are clear. Cervical spine surgery is noted. IMPRESSION: 1. 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 2. Thirteen rib-bearing thoracic vertebral bodies. 3. No acute abnormality. Electronically Signed   By: Marin Roberts M.D.   On: 12/22/2016 13:29   Dg Lumbar Spine Complete  Result Date: 12/22/2016 CLINICAL DATA:  Low back pain extending into the lower extremities, worse on the right. EXAM: LUMBAR SPINE - COMPLETE 4+ VIEW COMPARISON:  MRI lumbar spine 04/14/2013. FINDINGS: Five non  rib-bearing lumbar type vertebral bodies are present. Vertebral body heights and alignment are maintained. Anterior degenerative changes are again seen. No acute fracture traumatic subluxation is present. Atherosclerotic calcifications are present in the aorta and branch vessels. IMPRESSION: 1. Mild degenerative changes of the lumbar spine are stable. 2. No acute abnormality. 3. Atherosclerosis. Electronically Signed   By: Marin Roberts M.D.   On: 12/22/2016 13:31    Procedures Procedures (including critical care time)  Medications Ordered in UC Medications  ketorolac (TORADOL) injection 60 mg (60 mg Intramuscular Given 12/22/16 1225)     Initial Impression / Assessment and Plan / UC Course  I have reviewed the triage vital signs and the nursing notes.  Pertinent labs & imaging results that were available during my care of the patient were reviewed by me and considered in my medical decision making (see chart for details).  Clinical Course as of Dec 23 1343  Sun Dec 22, 2016  1337 DG Lumbar Spine Complete [EW]    Clinical Course User Index [EW] Hassan Rowan, MD    States pain is 8 out of 10 offered Toradol injection before he has his x-rays back  X-rays the back shows marked right is present will treat with prednisone 12 days Mobic 15 mg a Norflex 1 tablet twice a day. Recommend follow-up with his PCP or chiropractor's choice for re-evaluation his back and for the treatment of his back  Final Clinical Impressions(s) / UC Diagnoses   Final diagnoses:    Low back pain with sciatica, sciatica laterality unspecified, unspecified back pain laterality, unspecified chronicity  Muscle strain  Muscle spasm of back  Bilateral sciatica    New Prescriptions New Prescriptions   MELOXICAM (MOBIC) 15 MG TABLET    Take 1 tablet (15 mg total) by mouth daily.   ORPHENADRINE (NORFLEX) 100 MG TABLET    Take 1 tablet (100 mg total) by mouth 2 (two) times daily.   PREDNISONE (STERAPRED UNI-PAK 21 TAB) 10 MG (21) TBPK TABLET    6 tabs day 1 and 2, 5 tabs day 3 and 4, 4 tabs day 5 and 6, 3 tabs day 7 and 8, 2 tabs day 9 and 10, 1 tab day 11 and 12. Take orally     Note: This dictation was prepared with Dragon dictation along with smaller phrase technology. Any transcriptional errors that result from this process are unintentional.   Hassan Rowan, MD 12/22/16 1345

## 2017-01-04 ENCOUNTER — Encounter: Payer: Self-pay | Admitting: Gynecology

## 2017-01-04 ENCOUNTER — Encounter
Admission: EM | Disposition: A | Discharge status: Home / Self Care | Payer: Self-pay | Source: Home / Self Care | Attending: Emergency Medicine

## 2017-01-04 ENCOUNTER — Emergency Department
Admission: EM | Admit: 2017-01-04 | Discharge: 2017-01-05 | Disposition: A | Discharge status: Home / Self Care | Payer: 59 | Attending: Emergency Medicine | Admitting: Emergency Medicine

## 2017-01-04 ENCOUNTER — Ambulatory Visit
Admission: EM | Admit: 2017-01-04 | Discharge: 2017-01-04 | Disposition: A | Discharge status: Home / Self Care | Payer: 59 | Attending: Family Medicine | Admitting: Family Medicine

## 2017-01-04 ENCOUNTER — Encounter: Payer: Self-pay | Admitting: Emergency Medicine

## 2017-01-04 ENCOUNTER — Emergency Department: Payer: 59 | Admitting: Anesthesiology

## 2017-01-04 ENCOUNTER — Emergency Department: Payer: 59

## 2017-01-04 DIAGNOSIS — R130 Aphagia: Secondary | ICD-10-CM | POA: Diagnosis not present

## 2017-01-04 DIAGNOSIS — Z791 Long term (current) use of non-steroidal anti-inflammatories (NSAID): Secondary | ICD-10-CM | POA: Diagnosis not present

## 2017-01-04 DIAGNOSIS — T18128A Food in esophagus causing other injury, initial encounter: Secondary | ICD-10-CM | POA: Diagnosis not present

## 2017-01-04 DIAGNOSIS — R1314 Dysphagia, pharyngoesophageal phase: Secondary | ICD-10-CM | POA: Insufficient documentation

## 2017-01-04 DIAGNOSIS — Z79899 Other long term (current) drug therapy: Secondary | ICD-10-CM | POA: Diagnosis not present

## 2017-01-04 DIAGNOSIS — I10 Essential (primary) hypertension: Secondary | ICD-10-CM | POA: Diagnosis not present

## 2017-01-04 DIAGNOSIS — K228 Other specified diseases of esophagus: Secondary | ICD-10-CM | POA: Insufficient documentation

## 2017-01-04 DIAGNOSIS — T189XXA Foreign body of alimentary tract, part unspecified, initial encounter: Secondary | ICD-10-CM | POA: Diagnosis not present

## 2017-01-04 DIAGNOSIS — J449 Chronic obstructive pulmonary disease, unspecified: Secondary | ICD-10-CM | POA: Diagnosis not present

## 2017-01-04 DIAGNOSIS — K317 Polyp of stomach and duodenum: Secondary | ICD-10-CM | POA: Insufficient documentation

## 2017-01-04 DIAGNOSIS — K449 Diaphragmatic hernia without obstruction or gangrene: Secondary | ICD-10-CM | POA: Insufficient documentation

## 2017-01-04 DIAGNOSIS — K222 Esophageal obstruction: Secondary | ICD-10-CM | POA: Insufficient documentation

## 2017-01-04 DIAGNOSIS — X58XXXA Exposure to other specified factors, initial encounter: Secondary | ICD-10-CM | POA: Insufficient documentation

## 2017-01-04 DIAGNOSIS — F17210 Nicotine dependence, cigarettes, uncomplicated: Secondary | ICD-10-CM | POA: Diagnosis not present

## 2017-01-04 DIAGNOSIS — S199XXA Unspecified injury of neck, initial encounter: Secondary | ICD-10-CM | POA: Diagnosis not present

## 2017-01-04 DIAGNOSIS — K219 Gastro-esophageal reflux disease without esophagitis: Secondary | ICD-10-CM | POA: Diagnosis not present

## 2017-01-04 HISTORY — PX: ESOPHAGOGASTRODUODENOSCOPY: SHX5428

## 2017-01-04 LAB — COMPREHENSIVE METABOLIC PANEL
ALK PHOS: 70 U/L (ref 38–126)
ALT: 20 U/L (ref 17–63)
ANION GAP: 8 (ref 5–15)
AST: 23 U/L (ref 15–41)
Albumin: 4 g/dL (ref 3.5–5.0)
BILIRUBIN TOTAL: 0.7 mg/dL (ref 0.3–1.2)
BUN: 19 mg/dL (ref 6–20)
CALCIUM: 9.1 mg/dL (ref 8.9–10.3)
CO2: 27 mmol/L (ref 22–32)
Chloride: 104 mmol/L (ref 101–111)
Creatinine, Ser: 1.02 mg/dL (ref 0.61–1.24)
GFR calc non Af Amer: >60 mL/min (ref >60)
GLUCOSE: 116 mg/dL — AB (ref 65–99)
POTASSIUM: 3.8 mmol/L (ref 3.5–5.1)
Sodium: 139 mmol/L (ref 135–145)
Total Protein: 7.2 g/dL (ref 6.5–8.1)

## 2017-01-04 LAB — CBC WITH DIFFERENTIAL/PLATELET
BASOS PCT: 1 %
Basophils Absolute: 0.1 10*3/uL (ref 0–0.1)
EOS ABS: 0.1 10*3/uL (ref 0–0.7)
EOS PCT: 1 %
HCT: 41.5 % (ref 40.0–52.0)
Hemoglobin: 14 g/dL (ref 13.0–18.0)
LYMPHS ABS: 1.8 10*3/uL (ref 1.0–3.6)
Lymphocytes Relative: 14 %
MCH: 32.1 pg (ref 26.0–34.0)
MCHC: 33.7 g/dL (ref 32.0–36.0)
MCV: 95.1 fL (ref 80.0–100.0)
Monocytes Absolute: 0.7 10*3/uL (ref 0.2–1.0)
Monocytes Relative: 5 %
NEUTROS PCT: 79 %
Neutro Abs: 10.7 10*3/uL — ABNORMAL HIGH (ref 1.4–6.5)
PLATELETS: 172 10*3/uL (ref 150–440)
RBC: 4.36 MIL/uL — AB (ref 4.40–5.90)
RDW: 13 % (ref 11.5–14.5)
WBC: 13.4 10*3/uL — ABNORMAL HIGH (ref 3.8–10.6)

## 2017-01-04 LAB — PROTIME-INR
INR: 1.05
PROTHROMBIN TIME: 13.7 s (ref 11.4–15.2)

## 2017-01-04 LAB — LIPASE, BLOOD: Lipase: 24 U/L (ref 11–51)

## 2017-01-04 LAB — APTT: aPTT: 30 seconds (ref 24–36)

## 2017-01-04 SURGERY — EGD (ESOPHAGOGASTRODUODENOSCOPY)
Anesthesia: General

## 2017-01-04 MED ORDER — OXYCODONE HCL 5 MG PO TABS: 5.0000 mg | ORAL_TABLET | Freq: Once | ORAL | Status: DC | PRN

## 2017-01-04 MED ORDER — OMEPRAZOLE 20 MG PO CPDR: 20.0000 mg | DELAYED_RELEASE_CAPSULE | Freq: Two times a day (BID) | ORAL | 2 refills | Status: DC

## 2017-01-04 MED ORDER — LACTATED RINGERS IV SOLN
INTRAVENOUS | Status: DC | PRN
Start: 2017-01-04 — End: 2017-01-04
  Administered 2017-01-04: 21:00:00 via INTRAVENOUS

## 2017-01-04 MED ORDER — SUCCINYLCHOLINE CHLORIDE 20 MG/ML IJ SOLN
INTRAMUSCULAR | Status: DC | PRN
  Administered 2017-01-04: 100 mg via INTRAVENOUS

## 2017-01-04 MED ORDER — LIDOCAINE HCL (CARDIAC) 20 MG/ML IV SOLN
INTRAVENOUS | Status: DC | PRN
  Administered 2017-01-04: 100 mg via INTRAVENOUS

## 2017-01-04 MED ORDER — LIDOCAINE HCL 2 % EX GEL
CUTANEOUS | Status: AC
  Filled 2017-01-04: qty 5

## 2017-01-04 MED ORDER — GLUCAGON HCL RDNA (DIAGNOSTIC) 1 MG IJ SOLR
1.0000 mg | Freq: Once | INTRAMUSCULAR | Status: AC
  Administered 2017-01-04: 1 mg via INTRAVENOUS
  Filled 2017-01-04: qty 1

## 2017-01-04 MED ORDER — PROPOFOL 10 MG/ML IV BOLUS
INTRAVENOUS | Status: AC
  Filled 2017-01-04: qty 20

## 2017-01-04 MED ORDER — PROPOFOL 10 MG/ML IV BOLUS
INTRAVENOUS | Status: DC | PRN
  Administered 2017-01-04: 30 mg via INTRAVENOUS
  Administered 2017-01-04: 50 mg via INTRAVENOUS
  Administered 2017-01-04: 200 mg via INTRAVENOUS

## 2017-01-04 MED ORDER — FENTANYL CITRATE (PF) 100 MCG/2ML IJ SOLN
25.0000 ug | INTRAMUSCULAR | Status: DC | PRN
  Administered 2017-01-04 (×2): 25 ug via INTRAVENOUS

## 2017-01-04 MED ORDER — ONDANSETRON HCL 4 MG/2ML IJ SOLN
INTRAMUSCULAR | Status: AC
  Filled 2017-01-04: qty 2

## 2017-01-04 MED ORDER — PHENYLEPHRINE HCL 10 MG/ML IJ SOLN
INTRAMUSCULAR | Status: AC
  Filled 2017-01-04: qty 1

## 2017-01-04 MED ORDER — EPHEDRINE SULFATE 50 MG/ML IJ SOLN
INTRAMUSCULAR | Status: AC
  Filled 2017-01-04: qty 1

## 2017-01-04 MED ORDER — NITROGLYCERIN 0.4 MG SL SUBL
0.4000 mg | SUBLINGUAL_TABLET | Freq: Once | SUBLINGUAL | Status: AC
  Administered 2017-01-04: 0.4 mg via SUBLINGUAL
  Filled 2017-01-04: qty 1

## 2017-01-04 MED ORDER — SODIUM CHLORIDE 0.9 % IJ SOLN
INTRAMUSCULAR | Status: AC
  Filled 2017-01-04: qty 10

## 2017-01-04 MED ORDER — OXYCODONE HCL 5 MG/5ML PO SOLN: 5.0000 mg | Freq: Once | ORAL | Status: DC | PRN

## 2017-01-04 MED ORDER — ONDANSETRON HCL 4 MG/2ML IJ SOLN
INTRAMUSCULAR | Status: DC | PRN
  Administered 2017-01-04: 4 mg via INTRAVENOUS

## 2017-01-04 NOTE — Transfer of Care (Signed)
Immediate Anesthesia Transfer of Care Note  Patient: Tristan Thompson State  Procedure(s) Performed: Procedure(s): ESOPHAGOGASTRODUODENOSCOPY (EGD) (N/A)  Patient Location: PACU  Anesthesia Type:General  Level of Consciousness: awake and alert   Airway & Oxygen Therapy: Patient Spontanous Breathing and Patient connected to face mask oxygen  Post-op Assessment: Report given to RN and Post -op Vital signs reviewed and stable  Post vital signs: Reviewed and stable  Last Vitals:  Vitals:   01/04/17 2015 01/04/17 2236  BP: (!) 157/93 (!) (P) 143/91  Pulse: 61   Resp: 18   Temp:  (P) 37.2 C    Last Pain:  Vitals:   01/04/17 2015  TempSrc:   PainSc: 3          Complications: No apparent anesthesia complications

## 2017-01-04 NOTE — ED Notes (Signed)
Patient transported to X-ray 

## 2017-01-04 NOTE — ED Provider Notes (Signed)
[facility] Emergency Department Provider Note  ____________________________________________  Time seen: Approximately 5:42 PM  I have reviewed the triage vital signs and the nursing notes.   HISTORY  Chief Complaint Foreign Body    HPI Tristan Thompson is a 51 y.o. male who complains of esophageal food impaction. He was eating pot roast at about 2:30 PM today, and was eating quickly at work and he took a large bite and felt suddenly get stuck in his upper esophagus just above the sternal notch. He's been unable to swallow anything since then. Every tries to drink fluids or swallow his saliva and feels like it also gets stuck makes the pain worse, and then he has to regurgitate it. He has not been able to pass any of the meat. Denies shortness of breath or difficulty speaking. Has discomfort in the throat but no other pain. Reports that this is happened about a year ago but was able to pass spontaneously after about 12 hours. Reports that he has had issues with swallowing ever since having a cervical fusion procedure in 2014.  Symptoms have been constant since onset. Onset was acute and sudden. Worse with any additional oral intake. No alleviating factors.     Past Medical History:  Diagnosis Date  . Anxiety   . Hypertension    has a prescription but has not had it filled.  Marland Kitchen Umbilical hernia    has had surgery to "patch" it     There are no active problems to display for this patient.    Past Surgical History:  Procedure Laterality Date  . ANTERIOR CERVICAL DECOMP/DISCECTOMY FUSION N/A 07/16/2013   Procedure: ANTERIOR CERVICAL DECOMPRESSION/DISCECTOMY FUSION CERVICAL THREE-FOUR,FOUR-FIVE AND CERVICAL SIX-SEVEN;  Surgeon: Reinaldo Meeker, MD;  Location: [facility];  Service: Neurosurgery;  Laterality: N/A;  . HERNIA REPAIR       Prior to Admission medications   Medication Sig Start Date End Date Taking? Authorizing Provider  b complex vitamins  tablet Take 1 tablet by mouth daily.    Historical Provider, MD  meloxicam (MOBIC) 15 MG tablet Take 1 tablet (15 mg total) by mouth daily. 12/22/16   Hassan Rowan, MD  orphenadrine (NORFLEX) 100 MG tablet Take 1 tablet (100 mg total) by mouth 2 (two) times daily. 12/22/16   Hassan Rowan, MD  predniSONE (STERAPRED UNI-PAK 21 TAB) 10 MG (21) TBPK tablet 6 tabs day 1 and 2, 5 tabs day 3 and 4, 4 tabs day 5 and 6, 3 tabs day 7 and 8, 2 tabs day 9 and 10, 1 tab day 11 and 12. Take orally 12/22/16   Hassan Rowan, MD  silver sulfADIAZINE (SILVADENE) 1 % cream Apply 1 application topically daily. 04/09/16   Lutricia Feil, PA-C     Allergies Patient has no known allergies.   No family history on file.  Social History Social History  Substance Use Topics  . Smoking status: Current Every Day Smoker    Types: Cigarettes  . Smokeless tobacco: Never Used  . Alcohol use Yes     Comment: six pack of beer/day    Review of Systems  Constitutional:   No fever or chills.  ENT:   Positive throat pain. No rhinorrhea.. Lymphatic: No swollen glands, No extremity swelling Endocrine: No hot/cold flashes. No significant weight change. No neck swelling. Cardiovascular:   No chest pain or syncope. Respiratory:   No dyspnea or cough. Gastrointestinal:   Negative for abdominal pain, vomiting and diarrhea.  Genitourinary:   Negative for dysuria or difficulty urinating. Musculoskeletal:   Negative for focal pain or swelling Neurological:   Negative for headaches or weakness. All other systems reviewed and are negative except as documented above in ROS and HPI.  ____________________________________________   PHYSICAL EXAM:  VITAL SIGNS: ED Triage Vitals  Enc Vitals Group     BP 01/04/17 1714 139/83     Pulse Rate 01/04/17 1714 69     Resp 01/04/17 1714 18     Temp 01/04/17 1714 98.8 F (37.1 C)     Temp Source 01/04/17 1714 Oral     SpO2 01/04/17 1714 96 %     Weight 01/04/17 1715 210 lb (95.3 kg)      Height 01/04/17 1715  (1.88 m)     Head Circumference --      Peak Flow --      Pain Score 01/04/17 1714 3     Pain Loc --      Pain Edu? --      Excl. in GC? --     Vital signs reviewed, nursing assessments reviewed.   Constitutional:   Alert and oriented. Well appearing and in no distress. Eyes:   No scleral icterus. No conjunctival pallor. PERRL. EOMI.  No nystagmus. ENT   Head:   Normocephalic and atraumatic.   Nose:   No congestion/rhinnorhea. No septal hematoma   Mouth/Throat:   MMM, no pharyngeal erythema. No peritonsillar mass.    Neck:   No stridor. No SubQ emphysema. No meningismus. Hematological/Lymphatic/Immunilogical:   No cervical lymphadenopathy. Cardiovascular:   RRR. Symmetric bilateral radial and DP pulses.  No murmurs.  Respiratory:   Normal respiratory effort without tachypnea nor retractions. Breath sounds are clear and equal bilaterally. No wheezes/rales/rhonchi. Gastrointestinal:   Soft and nontender. Non distended. There is no CVA tenderness.  No rebound, rigidity, or guarding. Genitourinary:   deferred Musculoskeletal:   Normal range of motion in all extremities. No joint effusions.  No lower extremity tenderness.  No edema. Neurologic:   Normal speech and language.  CN 2-10 normal. Motor grossly intact. No gross focal neurologic deficits are appreciated.  Skin:    Skin is warm, dry and intact. No rash noted.  No petechiae, purpura, or bullae.  ____________________________________________    LABS (pertinent positives/negatives) (all labs ordered are listed, but only abnormal results are displayed) Labs Reviewed  CBC WITH DIFFERENTIAL/PLATELET  PROTIME-INR  APTT  COMPREHENSIVE METABOLIC PANEL  LIPASE, BLOOD   ____________________________________________   EKG    ____________________________________________    RADIOLOGY  Dg Chest 2 View  Result Date: 01/04/2017 CLINICAL DATA:  Possible knee poles stuck in throat,  initial encounter EXAM: CHEST  2 VIEW COMPARISON:  None. FINDINGS: Postsurgical changes are noted in the cervical spine. The lungs are well aerated bilaterally. Bilateral nipple shadows are seen. No focal infiltrate or sizable effusion is noted. No acute bony abnormality is seen. Cardiac shadow is within normal limits. IMPRESSION: No acute abnormality noted. Electronically Signed   By: Alcide Clever M.D.   On: 01/04/2017 18:09    ____________________________________________   PROCEDURES Procedures  ____________________________________________   INITIAL IMPRESSION / ASSESSMENT AND PLAN / ED COURSE  Pertinent labs & imaging results that were available during my care of the patient were reviewed by me and considered in my medical decision making (see chart for details).  Patient presents with esophageal food impaction from meat. No bones. No other foreign body ingestion. This started about 3 hours ago.  We'll try nitroglycerin sublingual, glucagon, coke. If unsuccessful, patient will require GI intervention. We'll get a chest x-ray as well.     Clinical Course as of Jan 05 1940  Sat Jan 04, 2017  1835 No improvement of symptoms. Still having to regurgitate all oral intake.  [PS]  1923 Still no improvement. GI paged for endoscopy  [PS]  1930 D/w Dr. Allegra Lai, will call specials team for urgent endoscopy  [PS]    Clinical Course User Index [PS] Sharman Cheek, MD     ____________________________________________   FINAL CLINICAL IMPRESSION(S) / ED DIAGNOSES  Final diagnoses:  Esophageal obstruction due to food impaction      New Prescriptions   No medications on file     Portions of this note were generated with dragon dictation software. Dictation errors may occur despite best attempts at proofreading.    Sharman Cheek, MD 01/04/17 [phone]

## 2017-01-04 NOTE — Anesthesia Postprocedure Evaluation (Signed)
Anesthesia Post Note  Patient: Tristan Thompson  Procedure(s) Performed: Procedure(s) (LRB): ESOPHAGOGASTRODUODENOSCOPY (EGD) (N/A)  Patient location during evaluation: PACU Anesthesia Type: General Level of consciousness: awake and alert Pain management: pain level controlled Vital Signs Assessment: post-procedure vital signs reviewed and stable Respiratory status: spontaneous breathing, nonlabored ventilation, respiratory function stable and patient connected to nasal cannula oxygen Cardiovascular status: blood pressure returned to baseline and stable Postop Assessment: no signs of nausea or vomiting Anesthetic complications: no     Last Vitals:  Vitals:   01/04/17 2311 01/04/17 2321  BP:  131/81  Pulse: 70 63  Resp: 15 18  Temp:  37.8 C    Last Pain:  Vitals:   01/04/17 2321  TempSrc:   PainSc: 1                  Cleda Mccreedy Timeka Goette

## 2017-01-04 NOTE — Anesthesia Procedure Notes (Signed)
Procedure Name: Intubation Date/Time: 01/04/2017 9:49 PM Performed by: Ginger Carne Pre-anesthesia Checklist: Patient identified, Emergency Drugs available, Suction available, Patient being monitored and Timeout performed Patient Re-evaluated:Patient Re-evaluated prior to inductionOxygen Delivery Method: Circle system utilized Preoxygenation: Pre-oxygenation with 100% oxygen Intubation Type: IV induction, Rapid sequence and Cricoid Pressure applied Laryngoscope Size: Miller and 2 Tube type: Oral Tube size: 7.5 mm Number of attempts: 1 Airway Equipment and Method: Stylet Placement Confirmation: ETT inserted through vocal cords under direct vision,  positive ETCO2 and breath sounds checked- equal and bilateral Secured at: 21 cm Tube secured with: Tape Dental Injury: Teeth and Oropharynx as per pre-operative assessment

## 2017-01-04 NOTE — ED Provider Notes (Signed)
CSN: [id number]     Arrival date & time 01/04/17  1550 History   First MD Initiated Contact with Patient 01/04/17 1614     Chief Complaint  Patient presents with  . food stuck in throat   (Consider location/radiation/quality/duration/timing/severity/associated sxs/prior Treatment) HPI  Subjective 51 year old male who presents with food bolus stuck in his throat. He states that today he ate a pot roast meat too fast and became large. He feels that it is stuck just near the sternal notch. Is having no difficulty breathing. He states that it is very painful however. He's had no nausea or vomiting. There is no stridor. His had a previous anterior cervical discectomy and fusion and he states that whenever he eats food too fast it has a tendency of getting stuck. He has had this once in the past were spontaneously regurgitated the bolus up. Fused EMS transportation. He is going to see if his wife will transport him to the hospital.      Past Medical History:  Diagnosis Date  . Anxiety   . Hypertension    has a prescription but has not had it filled.  Marland Kitchen Umbilical hernia    has had surgery to "patch" it   Past Surgical History:  Procedure Laterality Date  . ANTERIOR CERVICAL DECOMP/DISCECTOMY FUSION N/A 07/16/2013   Procedure: ANTERIOR CERVICAL DECOMPRESSION/DISCECTOMY FUSION CERVICAL THREE-FOUR,FOUR-FIVE AND CERVICAL SIX-SEVEN;  Surgeon: Reinaldo Meeker, MD;  Location: [facility];  Service: Neurosurgery;  Laterality: N/A;  . HERNIA REPAIR     No family history on file. Social History  Substance Use Topics  . Smoking status: Current Every Day Smoker    Types: Cigarettes  . Smokeless tobacco: Never Used  . Alcohol use Yes     Comment: six pack of beer/day    Review of Systems  Constitutional: Positive for activity change. Negative for chills and fever.  HENT: Negative for drooling.   Gastrointestinal: Negative for nausea and vomiting.  All other systems reviewed and are  negative.   Allergies  Patient has no known allergies.  Home Medications   Prior to Admission medications   Medication Sig Start Date End Date Taking? Authorizing Provider  b complex vitamins tablet Take 1 tablet by mouth daily.   Yes Historical Provider, MD  meloxicam (MOBIC) 15 MG tablet Take 1 tablet (15 mg total) by mouth daily. 12/22/16  Yes Hassan Rowan, MD  orphenadrine (NORFLEX) 100 MG tablet Take 1 tablet (100 mg total) by mouth 2 (two) times daily. 12/22/16  Yes Hassan Rowan, MD  silver sulfADIAZINE (SILVADENE) 1 % cream Apply 1 application topically daily. 04/09/16  Yes Lutricia Feil, PA-C  predniSONE (STERAPRED UNI-PAK 21 TAB) 10 MG (21) TBPK tablet 6 tabs day 1 and 2, 5 tabs day 3 and 4, 4 tabs day 5 and 6, 3 tabs day 7 and 8, 2 tabs day 9 and 10, 1 tab day 11 and 12. Take orally 12/22/16   Hassan Rowan, MD   Meds Ordered and Administered this Visit  Medications - No data to display  BP (!) 151/94 (BP Location: Left Arm)   Pulse 63   Temp 98.2 F (36.8 C) (Oral)   Resp 16   Wt 210 lb (95.3 kg)   SpO2 99%   BMI 26.96 kg/m  No data found.   Physical Exam  Constitutional: He is oriented to person, place, and time. He appears well-developed and well-nourished. No distress.  Patient is alert oriented 3. He is in  no distress there is no stridor present. No nausea no vomiting. No respiratory distress.  Eyes: Pupils are equal, round, and reactive to light.  Neck: Normal range of motion.  Pulmonary/Chest: No stridor.  Musculoskeletal: Normal range of motion.  Neurological: He is alert and oriented to person, place, and time.  Skin: Skin is warm and dry. He is not diaphoretic.  Psychiatric: He has a normal mood and affect. His behavior is normal. Judgment and thought content normal.  Nursing note and vitals reviewed.   Urgent Care Course     Procedures (including critical care time)  Labs Review Labs Reviewed - No data to display  Imaging Review No results  found.   Visual Acuity Review  Right Eye Distance:   Left Eye Distance:   Bilateral Distance:    Right Eye Near:   Left Eye Near:    Bilateral Near:         MDM   1. Food impaction of esophagus, initial encounter    Patient is stable at this point time. Is no respiratory distressor stridor. Examination is able to swallow but feels that there is a restriction in his esophagus preventing full swallowing. I have recommended EMS transportation but the patient refused. He states he will have his wife transport him. He prefers to go to [facility] ED. I spoke with Herbert Seta the triage nurse to appraise him of his condition and his arrival. He left our facility in stable condition.    Lutricia Feil, PA-C 01/04/17 1718

## 2017-01-04 NOTE — ED Triage Notes (Signed)
Pt sent from [facility] for meat bolus stuck in throat. Pt reports a history of this happening before. Pt states was eating pot roast too fast and it got stuck. Pt speaking in complete sentences without difficulty. No stridor noted.

## 2017-01-04 NOTE — OR Nursing (Signed)
Patient was intubated by anesthesia staff and taken to PACU for recovery.

## 2017-01-04 NOTE — Op Note (Signed)
[facility] Gastroenterology Patient Name: Tristan Thompson Procedure Date: 01/04/2017 9:34 PM MRN: [id number] Account #: [id number] Date of Birth: June 05, 1966 Admit Type: Outpatient Age: 51 Room: [facility] ENDO ROOM 4 Gender: Male Note Status: Finalized Procedure:            Upper GI endoscopy Indications:          Esophageal dysphagia Providers:            Toney Reil MD, MD Medicines:            General Anesthesia Complications:        No immediate complications. Estimated blood loss:                        Minimal. Procedure:            Pre-Anesthesia Assessment:                       - Prior to the procedure, a History and Physical was                        performed, and patient medications and allergies were                        reviewed. The patient is competent. The risks and                        benefits of the procedure and the sedation options and                        risks were discussed with the patient. All questions                        were answered and informed consent was obtained.                        Patient identification and proposed procedure were                        verified by the physician, the nurse, the                        anesthesiologist, the anesthetist and the technician in                        the pre-procedure area in the procedure room. Mental                        Status Examination: alert and oriented. Airway                        Examination: normal oropharyngeal airway and neck                        mobility. Respiratory Examination: clear to                        auscultation. CV Examination: normal. Prophylactic                        Antibiotics: The patient does not require prophylactic  antibiotics. Prior Anticoagulants: The patient has                        taken no previous anticoagulant or antiplatelet agents.                        ASA Grade Assessment: II - A patient with  mild systemic                        disease. After reviewing the risks and benefits, the                        patient was deemed in satisfactory condition to undergo                        the procedure. The anesthesia plan was to use general                        anesthesia. Immediately prior to administration of                        medications, the patient was re-assessed for adequacy                        to receive sedatives. The heart rate, respiratory rate,                        oxygen saturations, blood pressure, adequacy of                        pulmonary ventilation, and response to care were                        monitored throughout the procedure. The physical status                        of the patient was re-assessed after the procedure.                       After obtaining informed consent, the endoscope was                        passed under direct vision. Throughout the procedure,                        the patient's blood pressure, pulse, and oxygen                        saturations were monitored continuously. The Endoscope                        was introduced through the mouth, and advanced to the                        second part of duodenum. The upper GI endoscopy was                        accomplished without difficulty. The patient tolerated  the procedure well. Findings:      Food bolus was found in the mid esophagus at 30cm from incisors. Able to       pass the scope around food bolus. A portion of food bolus was removed       with tripod and rest of it was pushed into stomach.      One mild benign-appearing, intrinsic stenosis was found 40 cm from the       incisors. And was traversed. A TTS dilator was passed through the scope.       Dilation with a 12-13.5-15 mm balloon dilator was performed to 15 mm.       The dilation site was examined following endoscope reinsertion and       showed no change. Estimated blood loss:  none. Biopsies were obtained       from the proximal and distal esophagus with cold forceps for histology       of suspected eosinophilic esophagitis.      A single 8 mm polypoid lesion with no bleeding was found in the duodenal       bulb. Biopsies were taken with a cold forceps for histology.      The second portion of the duodenum was normal.      A small hiatal hernia was present.      The entire examined stomach was normal. Impression:           - Food in the mid esophagus. Removal was successful.                       - Benign-appearing esophageal stenosis. Dilated.                        Biopsied.                       - Normal stomach.                       - A single duodenal polyp. Biopsied.                       - Normal second portion of the duodenum. Recommendation:       - Await pathology results.                       - Repeat upper endoscopy for retreatment for dilation                        as needed and polypectomy if duodenal bulb lesion comes                        back as adenoma on path.                       - Transport patient to ER.                       - Mechanical soft diet.                       - Continue present medications.                       - Use Prilosec (omeprazole) 20 mg  PO BID for 3 months.                       - Follow up with GI as outpt Procedure Code(s):    --- Professional ---          [id number]           43247, Esophagogastroduodenoscopy, flexible, transoral;                        with removal of foreign body(s)                       43249, Esophagogastroduodenoscopy, flexible, transoral;                        with transendoscopic balloon dilation of esophagus                        (less than 30 mm diameter)                       43239, Esophagogastroduodenoscopy, flexible, transoral;                        with biopsy, single or multiple Diagnosis Code(s):    --- Professional ---                       W29.562Z, Food in esophagus causing  other injury,                        initial encounter                       K22.2, Esophageal obstruction                       K31.7, Polyp of stomach and duodenum                       R13.14, Dysphagia, pharyngoesophageal phase CPT copyright 2016 American [employer] Association. All rights reserved. The codes documented in this report are preliminary and upon coder review may  be revised to meet current compliance requirements. Dr. Libby Maw Toney Reil MD, MD 01/04/2017 10:33:04 PM This report has been signed electronically. Number of Addenda: 0 Note Initiated On: 01/04/2017 9:34 PM      [facility]

## 2017-01-04 NOTE — ED Triage Notes (Signed)
Per patient ever since he had  Neck surgery and plate put in neck whenever he eats his food to fast, the food get stuck in throat

## 2017-01-04 NOTE — Anesthesia Preprocedure Evaluation (Addendum)
Anesthesia Evaluation  Patient identified by MRN, date of birth, ID band Patient awake    Reviewed: Allergy & Precautions, H&P , NPO status , Patient's Chart, lab work & pertinent test results  History of Anesthesia Complications Negative for: history of anesthetic complications  Airway Mallampati: III  TM Distance: <3 FB Neck ROM: limited    Dental  (+) Poor Dentition, Chipped, Caps   Pulmonary neg shortness of breath, COPD, Current Smoker,    Pulmonary exam normal breath sounds clear to auscultation       Cardiovascular Exercise Tolerance: Good hypertension, (-) angina(-) Past MI and (-) DOE Normal cardiovascular exam Rhythm:regular Rate:Normal     Neuro/Psych PSYCHIATRIC DISORDERS Anxiety negative neurological ROS     GI/Hepatic negative GI ROS, Neg liver ROS, neg GERD  ,  Endo/Other  negative endocrine ROS  Renal/GU      Musculoskeletal   Abdominal   Peds  Hematology negative hematology ROS (+)   Anesthesia Other Findings Food bolus  Signs and symptoms suggestive of sleep apnea   Past Medical History: No date: Anxiety No date: Hypertension     Comment: has a prescription but has not had it filled. No date: Umbilical hernia     Comment: has had surgery to "patch" it  Past Surgical History: 07/16/2013: ANTERIOR CERVICAL DECOMP/DISCECTOMY FUSION N/A     Comment: Procedure: ANTERIOR CERVICAL               DECOMPRESSION/DISCECTOMY FUSION CERVICAL               THREE-FOUR,FOUR-FIVE AND CERVICAL SIX-SEVEN;                Surgeon: Reinaldo Meeker, MD;  Location: [facility]               NEURO ORS;  Service: Neurosurgery;  Laterality:              N/A; No date: HERNIA REPAIR  BMI    Body Mass Index:  26.96 kg/m      Reproductive/Obstetrics negative OB ROS                            Anesthesia Physical Anesthesia Plan  ASA: III and emergent  Anesthesia Plan: General and General ETT    Post-op Pain Management:    Induction: Intravenous  Airway Management Planned: LMA  Additional Equipment:   Intra-op Plan:   Post-operative Plan: Extubation in OR  Informed Consent: I have reviewed the patients History and Physical, chart, labs and discussed the procedure including the risks, benefits and alternatives for the proposed anesthesia with the patient or authorized representative who has indicated his/her understanding and acceptance.   Dental Advisory Given  Plan Discussed with: Anesthesiologist, CRNA and Surgeon  Anesthesia Plan Comments: (Patient consented for risks of anesthesia including but not limited to:  - adverse reactions to medications - damage to teeth, lips or other oral mucosa - sore throat or hoarseness - Damage to heart, brain, lungs or loss of life  Patient voiced understanding.)        Anesthesia Quick Evaluation

## 2017-01-04 NOTE — Consult Note (Signed)
Consultation  Referring Provider:     No ref. provider found Primary Care Physician:  No PCP Per Patient Primary Gastroenterologist:  None        Reason for Consultation:     Food impaction  Date of Admission:  01/04/2017 Date of Consultation:  01/04/2017         HPI:   Tristan Thompson is a 51 y.o. male presented to ER with food impaction in esophagus. He was eating pot roast at about 2:30 PM today, and was eating quickly at work and he took a large bite and felt suddenly get stuck in his upper esophagus just above the sternal notch. He's been unable to swallow anything since then. Every time he tries to drink fluids or swallow his saliva and feels like it also gets stuck makes the pain worse, and then he has to regurgitate it. He has not been able to pass any of the meat. Denies shortness of breath or difficulty speaking. Has discomfort in the throat but no other pain. Reports that this is happened about a year ago but was able to pass spontaneously after about 12 hours. Reports that he has had issues with swallowing ever since having a cervical fusion procedure in 2014.  Not on PPI at home. Denies any other upper GI symptoms.   Past Medical History:  Diagnosis Date  . Anxiety   . Hypertension    has a prescription but has not had it filled.  Marland Kitchen Umbilical hernia    has had surgery to "patch" it    Past Surgical History:  Procedure Laterality Date  . ANTERIOR CERVICAL DECOMP/DISCECTOMY FUSION N/A 07/16/2013   Procedure: ANTERIOR CERVICAL DECOMPRESSION/DISCECTOMY FUSION CERVICAL THREE-FOUR,FOUR-FIVE AND CERVICAL SIX-SEVEN;  Surgeon: Reinaldo Meeker, MD;  Location: [facility] NEURO ORS;  Service: Neurosurgery;  Laterality: N/A;  . HERNIA REPAIR      Prior to Admission medications   Medication Sig Start Date End Date Taking? Authorizing Provider  orphenadrine (NORFLEX) 100 MG tablet Take 1 tablet (100 mg total) by mouth 2 (two) times daily. 12/22/16  Yes Hassan Rowan, MD  meloxicam (MOBIC) 15 MG  tablet Take 1 tablet (15 mg total) by mouth daily. Patient not taking: Reported on 01/04/2017 12/22/16   Hassan Rowan, MD  predniSONE (STERAPRED UNI-PAK 21 TAB) 10 MG (21) TBPK tablet 6 tabs day 1 and 2, 5 tabs day 3 and 4, 4 tabs day 5 and 6, 3 tabs day 7 and 8, 2 tabs day 9 and 10, 1 tab day 11 and 12. Take orally Patient not taking: Reported on 01/04/2017 12/22/16   Hassan Rowan, MD  silver sulfADIAZINE (SILVADENE) 1 % cream Apply 1 application topically daily. Patient not taking: Reported on 01/04/2017 04/09/16   Lutricia Feil, PA-C    History reviewed. No pertinent family history.   Social History  Substance Use Topics  . Smoking status: Current Every Day Smoker    Types: Cigarettes  . Smokeless tobacco: Never Used  . Alcohol use Yes     Comment: six pack of beer/day    Allergies as of 01/04/2017  . (No Known Allergies)    Review of Systems:    All systems reviewed and negative except where noted in HPI.   Physical Exam:  Vital signs in last 24 hours: Temp:  [98.2 F (36.8 C)-98.8 F (37.1 C)] 98.8 F (37.1 C) (04/28 1714) Pulse Rate:  [61-69] 61 (04/28 2015) Resp:  [16-18] 18 (04/28 2015) BP: (139-157)/(83-94) 157/93 (04/28  2015) SpO2:  [96 %-100 %] 100 % (04/28 2015) Weight:  [95.3 kg (210 lb)] 95.3 kg (210 lb) (04/28 1715)   General:   Pleasant, cooperative in NAD Head:  Normocephalic and atraumatic. Eyes:   No icterus.   Conjunctiva pink. PERRLA. Ears:  Normal auditory acuity. Neck:  Supple; no masses or thyroidomegaly Lungs: Respirations even and unlabored. Lungs clear to auscultation bilaterally.   No wheezes, crackles, or rhonchi.  Heart:  Regular rate and rhythm;  Without murmur, clicks, rubs or gallops Abdomen:  Soft, nondistended, nontender. Normal bowel sounds. No appreciable masses or hepatomegaly.  No rebound or guarding.  Rectal:  Not performed. Msk:  Symmetrical without gross deformities.  Strength Extremities:  Without edema, cyanosis or  clubbing. Neurologic:  Alert and oriented x3;  grossly normal neurologically. Skin:  Intact without significant lesions or rashes. Cervical Nodes:  No significant cervical adenopathy. Psych:  Alert and cooperative. Normal affect.  LAB RESULTS:  Recent Labs  01/04/17 2004  WBC 13.4*  HGB 14.0  HCT 41.5  PLT 172   BMET  Recent Labs  01/04/17 2004  NA 139  K 3.8  CL 104  CO2 27  GLUCOSE 116*  BUN 19  CREATININE 1.02  CALCIUM 9.1   LFT  Recent Labs  01/04/17 2004  PROT 7.2  ALBUMIN 4.0  AST 23  ALT 20  ALKPHOS 70  BILITOT 0.7   PT/INR  Recent Labs  01/04/17 2004  LABPROT 13.7  INR 1.05    STUDIES: Dg Chest 2 View  Result Date: 01/04/2017 CLINICAL DATA:  Possible knee poles stuck in throat, initial encounter EXAM: CHEST  2 VIEW COMPARISON:  None. FINDINGS: Postsurgical changes are noted in the cervical spine. The lungs are well aerated bilaterally. Bilateral nipple shadows are seen. No focal infiltrate or sizable effusion is noted. No acute bony abnormality is seen. Cardiac shadow is within normal limits. IMPRESSION: No acute abnormality noted. Electronically Signed   By: Alcide Clever M.D.   On: 01/04/2017 18:09      Impression / Plan:   Tristan Thompson is a 51 y.o. y/o male with food impaction. Performed EGD and food bolus was removed. Mild GE junction peptic stricture is present. Performed CRE balloon dilation to 15mm. A polypoid lesion in duodenal bulb, biopsied  - can be discharged home from ER - prescribe prilosec  BID for 3months - f/u path - f/u with [facility] as outpt - Advance diet as tolerated - Avoid hard foods  Thank you for involving me in the care of this patient.      LOS: 0 days   Lannette Donath, MD  01/04/2017, 10:33 PM

## 2017-01-04 NOTE — ED Notes (Signed)
Tristan Thompson, OR, calls to inform EGD to be performed on pt at 2130 to 2200.  Consent to bedside

## 2017-01-04 NOTE — ED Notes (Addendum)
PT reports pot roast stuck in throat approx 1500, has happened before once since s/p cervical fusion procedure; reports vomiting after swallowing water

## 2017-01-04 NOTE — Anesthesia Post-op Follow-up Note (Cosign Needed)
Anesthesia QCDR form completed.        

## 2017-01-05 NOTE — ED Notes (Signed)
Pt to wait for procedure in PACU; orderly called for transport, Jasmine December from Endoscopy called aand said no consent needed att, pt and family updated

## 2017-01-06 ENCOUNTER — Encounter: Payer: Self-pay | Admitting: Gastroenterology

## 2017-01-07 LAB — SURGICAL PATHOLOGY

## 2017-01-09 ENCOUNTER — Encounter: Payer: Self-pay | Admitting: Gastroenterology

## 2017-01-15 ENCOUNTER — Ambulatory Visit: Payer: 59 | Admitting: Gastroenterology

## 2017-01-21 ENCOUNTER — Encounter: Payer: Self-pay | Admitting: Gastroenterology

## 2017-01-21 ENCOUNTER — Ambulatory Visit (INDEPENDENT_AMBULATORY_CARE_PROVIDER_SITE_OTHER): Payer: 59 | Admitting: Gastroenterology

## 2017-01-21 VITALS — BP 130/82 | HR 56 | Temp 98.2°F | Ht 74.0 in | Wt 205.8 lb

## 2017-01-21 DIAGNOSIS — K222 Esophageal obstruction: Secondary | ICD-10-CM

## 2017-01-21 DIAGNOSIS — G8929 Other chronic pain: Secondary | ICD-10-CM | POA: Insufficient documentation

## 2017-01-21 DIAGNOSIS — F1020 Alcohol dependence, uncomplicated: Secondary | ICD-10-CM | POA: Insufficient documentation

## 2017-01-21 DIAGNOSIS — J302 Other seasonal allergic rhinitis: Secondary | ICD-10-CM | POA: Insufficient documentation

## 2017-01-21 DIAGNOSIS — M549 Dorsalgia, unspecified: Secondary | ICD-10-CM

## 2017-01-21 DIAGNOSIS — F419 Anxiety disorder, unspecified: Secondary | ICD-10-CM | POA: Insufficient documentation

## 2017-01-21 DIAGNOSIS — K219 Gastro-esophageal reflux disease without esophagitis: Secondary | ICD-10-CM | POA: Diagnosis not present

## 2017-01-21 DIAGNOSIS — I10 Essential (primary) hypertension: Secondary | ICD-10-CM | POA: Insufficient documentation

## 2017-01-21 NOTE — Progress Notes (Signed)
Primary Care Physician: Patient, No Pcp Per  Primary Gastroenterologist:  Dr. Wyline Mood   Chief Complaint  Patient presents with  . Follow-up    HPI: BRANDOM KERWIN is a 51 y.o. male.He is here today for a hospital follow up.   Summary of history : He was seen by Dr Keturah Shavers on 01/04/17 when he presented with a food impaction , prior similar episode which resolved spontaneously a year back. He has had issues with swallowing since a cervical fusion procedure back  In 2014. An EGD was performed and a food bolus was taken out at the GE junction , the area dilated to 15 mm and a polypoid lesion in the duodenum was biopsied and path returned as ectopic gastric tissue , esophageal bx showed no eosinophils .    Interval history   01/04/2017-  01/21/2017    No issues with swallowing since dilation , no issues with swallowing.  Current Outpatient Prescriptions  Medication Sig Dispense Refill  . meloxicam (MOBIC) 15 MG tablet Take 1 tablet (15 mg total) by mouth daily. 30 tablet 0  . omeprazole (PRILOSEC) 20 MG capsule Take 1 capsule (20 mg total) by mouth 2 (two) times daily before a meal. 60 capsule 2  . orphenadrine (NORFLEX) 100 MG tablet Take 1 tablet (100 mg total) by mouth 2 (two) times daily. (Patient not taking: Reported on 01/21/2017) 60 tablet 0  . predniSONE (STERAPRED UNI-PAK 21 TAB) 10 MG (21) TBPK tablet 6 tabs day 1 and 2, 5 tabs day 3 and 4, 4 tabs day 5 and 6, 3 tabs day 7 and 8, 2 tabs day 9 and 10, 1 tab day 11 and 12. Take orally (Patient not taking: Reported on 01/04/2017) 42 tablet 0  . silver sulfADIAZINE (SILVADENE) 1 % cream Apply 1 application topically daily. (Patient not taking: Reported on 01/04/2017) 50 g 0   No current facility-administered medications for this visit.     Allergies as of 01/21/2017  . (No Known Allergies)    ROS:  General: Negative for anorexia, weight loss, fever, chills, fatigue, weakness. ENT: Negative for hoarseness, difficulty swallowing  , nasal congestion. CV: Negative for chest pain, angina, palpitations, dyspnea on exertion, peripheral edema.  Respiratory: Negative for dyspnea at rest, dyspnea on exertion, cough, sputum, wheezing.  GI: See history of present illness. GU:  Negative for dysuria, hematuria, urinary incontinence, urinary frequency, nocturnal urination.  Endo: Negative for unusual weight change.    Physical Examination:   BP 130/82 (BP Location: Right Arm, Patient Position: Sitting, Cuff Size: Large)   Pulse (!) 56   Temp 98.2 F (36.8 C) (Oral)   Ht 6\' 2"  (1.88 m)   Wt 205 lb 12.8 oz (93.4 kg)   BMI 26.42 kg/m   General: Well-nourished, well-developed in no acute distress.  Eyes: No icterus. Conjunctivae pink. Mouth: Oropharyngeal mucosa moist and pink , no lesions erythema or exudate. Lungs: Clear to auscultation bilaterally. Non-labored. Heart: Regular rate and rhythm, no murmurs rubs or gallops.  Abdomen: Bowel sounds are normal, nontender, nondistended, no hepatosplenomegaly or masses, no abdominal bruits or hernia , no rebound or guarding.   Extremities: No lower extremity edema. No clubbing or deformities. Neuro: Alert and oriented x 3.  Grossly intact. Skin: Warm and dry, no jaundice.   Psych: Alert and cooperative, normal mood and affect.   Imaging Studies: Dg Chest 2 View  Result Date: 01/04/2017 CLINICAL DATA:  Possible knee poles stuck in throat, initial encounter EXAM: CHEST  2 VIEW COMPARISON:  None. FINDINGS: Postsurgical changes are noted in the cervical spine. The lungs are well aerated bilaterally. Bilateral nipple shadows are seen. No focal infiltrate or sizable effusion is noted. No acute bony abnormality is seen. Cardiac shadow is within normal limits. IMPRESSION: No acute abnormality noted. Electronically Signed   By: Alcide CleverMark  Lukens M.D.   On: 01/04/2017 18:09    Assessment and Plan:   Jodi GeraldsRobert J Sol is a 51 y.o. y/o male is here today to follow up to a recent episode of food  impaction with underlying peptic stricture that was dilated during extraction .   Plan   1. GERD leading to peptic stricture- no issues with swallowing presenlty and will call if he does have dysphagia 2. Counseled on life style changes, suggest to use PPI first thing in the morning on empty stomach and eat 30 minutes after. Advised on the use of a wedge pillow at night , avoid meals for 2 hours prior to bed time.   Discussed the risks and benefits of long term PPI use including but not limited to bone loss, chronic kidney disease, infections , low magnesium . Aim to use at the lowest dose for the shortest period of time   Patient information on GERD provided , advised to stop smoking   Follow up in 4 months    Dr Wyline MoodKiran  Ngozi Alvidrez  MD

## 2017-02-27 DIAGNOSIS — J069 Acute upper respiratory infection, unspecified: Secondary | ICD-10-CM | POA: Diagnosis not present

## 2017-03-25 DIAGNOSIS — S93501A Unspecified sprain of right great toe, initial encounter: Secondary | ICD-10-CM | POA: Diagnosis not present

## 2017-05-19 ENCOUNTER — Ambulatory Visit: Payer: 59 | Admitting: Gastroenterology

## 2017-05-19 ENCOUNTER — Encounter: Payer: Self-pay | Admitting: Gastroenterology

## 2017-06-26 DIAGNOSIS — Z23 Encounter for immunization: Secondary | ICD-10-CM | POA: Diagnosis not present

## 2017-08-19 ENCOUNTER — Other Ambulatory Visit: Payer: Self-pay

## 2017-08-19 ENCOUNTER — Encounter: Payer: Self-pay | Admitting: Emergency Medicine

## 2017-08-19 ENCOUNTER — Emergency Department
Admission: EM | Admit: 2017-08-19 | Discharge: 2017-08-19 | Disposition: A | Discharge status: Home / Self Care | Payer: 59 | Attending: Emergency Medicine | Admitting: Emergency Medicine

## 2017-08-19 DIAGNOSIS — M545 Low back pain: Secondary | ICD-10-CM | POA: Diagnosis not present

## 2017-08-19 DIAGNOSIS — M549 Dorsalgia, unspecified: Secondary | ICD-10-CM

## 2017-08-19 DIAGNOSIS — I10 Essential (primary) hypertension: Secondary | ICD-10-CM | POA: Diagnosis not present

## 2017-08-19 DIAGNOSIS — M62830 Muscle spasm of back: Secondary | ICD-10-CM

## 2017-08-19 DIAGNOSIS — F17210 Nicotine dependence, cigarettes, uncomplicated: Secondary | ICD-10-CM | POA: Diagnosis not present

## 2017-08-19 DIAGNOSIS — G8929 Other chronic pain: Secondary | ICD-10-CM | POA: Insufficient documentation

## 2017-08-19 HISTORY — DX: Dorsalgia, unspecified: M54.9

## 2017-08-19 HISTORY — DX: Other chronic pain: G89.29

## 2017-08-19 MED ORDER — CYCLOBENZAPRINE HCL 10 MG PO TABS
10.0000 mg | ORAL_TABLET | Freq: Once | ORAL | Status: AC
  Administered 2017-08-19: 10 mg via ORAL
  Filled 2017-08-19: qty 1

## 2017-08-19 MED ORDER — IBUPROFEN 800 MG PO TABS
800.0000 mg | ORAL_TABLET | Freq: Once | ORAL | Status: AC
  Administered 2017-08-19: 800 mg via ORAL
  Filled 2017-08-19: qty 1

## 2017-08-19 MED ORDER — CYCLOBENZAPRINE HCL 10 MG PO TABS: 10.0000 mg | ORAL_TABLET | Freq: Three times a day (TID) | ORAL | 0 refills | Status: DC | PRN

## 2017-08-19 MED ORDER — IBUPROFEN 800 MG PO TABS: 800.0000 mg | ORAL_TABLET | Freq: Three times a day (TID) | ORAL | 0 refills | Status: DC | PRN

## 2017-08-19 MED ORDER — MORPHINE SULFATE (PF) 4 MG/ML IV SOLN
4.0000 mg | Freq: Once | INTRAVENOUS | Status: AC
  Administered 2017-08-19: 4 mg via INTRAMUSCULAR
  Filled 2017-08-19: qty 1

## 2017-08-19 NOTE — Discharge Instructions (Signed)
You are being treated for likely inflammation and muscle spasm, also called musculoskeletal pain of the low back.  Return to the emergency department immediately for any worsening condition including worsening or uncontrolled pain, weakness, numbness, urinating or pooping on herself, fevers, abdominal pain, chest pain or trouble breathing, or any other symptoms concerning to you.  You may use a heating pad, and may consider massage to help with low back pain as well.

## 2017-08-19 NOTE — ED Provider Notes (Signed)
[facility] Emergency Department Provider Note ____________________________________________   I have reviewed the triage vital signs and the triage nursing note.  HISTORY  Chief Complaint Back Pain   Historian Patient  HPI Tristan Thompson is a 51 y.o. male with chronic low back pain, history of cervical stenosis s/p surgery in 2014, presents with low back pain that feels like spasm for about 3 days.  Gradual onset and gradual worsening.  Worse when he moves his trunk or tries to sit up or lay back.  He has had a mild upper respiratory infection with coughing and sneezing, but no wheezing or chest pain.  He thinks he had a low-grade fever as well.  No new traumatic injury or fall.  He works in a machine at work, but does not report any known overuse type injury to his low back.  No weakness or numbness or incontinence.  Pain is moderate to severe at times.    Past Medical History:  Diagnosis Date  . Anxiety   . Chronic back pain   . Hypertension    has a prescription but has not had it filled.  Marland Kitchen Umbilical hernia    has had surgery to "patch" it    Patient Active Problem List   Diagnosis Date Noted  . Alcohol dependence (HCC) 01/21/2017  . Anxiety 01/21/2017  . Chronic back pain 01/21/2017  . Hypertension 01/21/2017  . Seasonal allergies 01/21/2017    Past Surgical History:  Procedure Laterality Date  . ANTERIOR CERVICAL DECOMP/DISCECTOMY FUSION N/A 07/16/2013   Procedure: ANTERIOR CERVICAL DECOMPRESSION/DISCECTOMY FUSION CERVICAL THREE-FOUR,FOUR-FIVE AND CERVICAL SIX-SEVEN;  Surgeon: Reinaldo Meeker, MD;  Location: [facility];  Service: Neurosurgery;  Laterality: N/A;  . BACK SURGERY    . ESOPHAGOGASTRODUODENOSCOPY N/A 01/04/2017   Procedure: ESOPHAGOGASTRODUODENOSCOPY (EGD);  Surgeon: Toney Reil, MD;  Location: [facility] ENDOSCOPY;  Service: Gastroenterology;  Laterality: N/A;  . HERNIA REPAIR      Prior to Admission medications    Medication Sig Start Date End Date Taking? Authorizing Provider  cyclobenzaprine (FLEXERIL) 10 MG tablet Take 1 tablet (10 mg total) by mouth 3 (three) times daily as needed for muscle spasms. 08/19/17   Governor Rooks, MD  ibuprofen (ADVIL,MOTRIN) 800 MG tablet Take 1 tablet (800 mg total) by mouth every 8 (eight) hours as needed. 08/19/17   Governor Rooks, MD    No Known Allergies  History reviewed. No pertinent family history.  Social History Social History   Tobacco Use  . Smoking status: Current Every Day Smoker    Packs/day: 0.50    Types: Cigarettes  . Smokeless tobacco: Never Used  Substance Use Topics  . Alcohol use: Yes    Alcohol/week: 2.4 oz    Types: 4 Cans of beer per week    Comment: six pack of beer/day  . Drug use: No    Review of Systems  Constitutional: Negative for fever. Eyes: Negative for visual changes. ENT: Negative for sore throat. Cardiovascular: Negative for chest pain. Respiratory: Negative for shortness of breath.  Mild intermittent cough. Gastrointestinal: Negative for abdominal pain, vomiting and diarrhea. Genitourinary: Negative for dysuria. Musculoskeletal: Positive for back pain as per HPI. Skin: Negative for rash. Neurological: Negative for headache.  ____________________________________________   PHYSICAL EXAM:  VITAL SIGNS: ED Triage Vitals  Enc Vitals Group     BP 08/19/17 0624 (!) 149/93     Pulse Rate 08/19/17 0624 77     Resp 08/19/17 0624 17  Temp 08/19/17 0625 97.9 F (36.6 C)     Temp Source 08/19/17 0625 Oral     SpO2 08/19/17 0624 98 %     Weight 08/19/17 0623 205 lb (93 kg)     Height --      Head Circumference --      Peak Flow --      Pain Score 08/19/17 0627 10     Pain Loc --      Pain Edu? --      Excl. in GC? --      Constitutional: Alert and oriented. Well appearing and in no distress. HEENT   Head: Normocephalic and atraumatic.      Eyes: Conjunctivae are normal. Pupils equal and round.        Ears:         Nose: No congestion/rhinnorhea.   Mouth/Throat: Mucous membranes are moist.   Neck: No stridor. Cardiovascular/Chest: Normal rate, regular rhythm.  No murmurs, rubs, or gallops. Respiratory: Normal respiratory effort without tachypnea nor retractions. Breath sounds are clear and equal bilaterally. No wheezes/rales/rhonchi. Gastrointestinal: Soft. No distention, no guarding, no rebound. Nontender.    Genitourinary/rectal:Deferred Musculoskeletal: No spinal point tenderness of the thoracic or lumbar spine.  He does have tense and tender muscle spasms in the paraspinous muscles especially in the lumbar area.  Pain elicited/exacerbated when asked patient to sit forward in order to listen to his lungs which were clear.  Nontender with normal range of motion in all extremities. No joint effusions.  No lower extremity tenderness.  No edema. Neurologic:  Normal speech and language. No gross or focal neurologic deficits are appreciated. Skin:  Skin is warm, dry and intact. No rash noted. Psychiatric: Mood and affect are normal. Speech and behavior are normal. Patient exhibits appropriate insight and judgment.   ____________________________________________  LABS (pertinent positives/negatives) I, Governor Rooksebecca Joaquim Tolen, MD the attending physician have reviewed the labs noted below.  Labs Reviewed - No data to display  ____________________________________________    EKG I, Governor Rooksebecca Ilian Wessell, MD, the attending physician have personally viewed and interpreted all ECGs.  None ____________________________________________  RADIOLOGY All Xrays were viewed by me.  Imaging interpreted by Radiologist, and I, Governor Rooksebecca Ariyon Gerstenberger, MD the attending physician have reviewed the radiologist interpretation noted below.  None __________________________________________  PROCEDURES  Procedure(s) performed: None  Critical Care performed: None   ____________________________________________  ED COURSE  / ASSESSMENT AND PLAN  Pertinent labs & imaging results that were available during my care of the patient were reviewed by me and considered in my medical decision making (see chart for details).   Suspect musculoskeletal low back pain in patient with chronic back pain and reassuring physical exam overall.  Given symptomatic treatment in the ER and will dc with antiinflammatory ibuprofen and flexeril.  Discussed no narcotics.   DIFFERENTIAL DIAGNOSIS: Including but not limited to muscle spasm, pinched nerve, rib fracture intra-abdominal emergency causes of back pain, spinal fracture, cauda equina, etc.  CONSULTATIONS:   None   Patient / Family / Caregiver informed of clinical course, medical decision-making process, and agree with plan.   I discussed return precautions, follow-up instructions, and discharge instructions with patient and/or family.  Discharge Instructions : You are being treated for likely inflammation and muscle spasm, also called musculoskeletal pain of the low back.  Return to the emergency department immediately for any worsening condition including worsening or uncontrolled pain, weakness, numbness, urinating or pooping on herself, fevers, abdominal pain, chest pain or trouble breathing, or any  other symptoms concerning to you.  You may use a heating pad, and may consider massage to help with low back pain as well.    ___________________________________________   FINAL CLINICAL IMPRESSION(S) / ED DIAGNOSES   Final diagnoses:  Muscle spasm of back  Musculoskeletal back pain      ___________________________________________        Note: This dictation was prepared with Dragon dictation. Any transcriptional errors that result from this process are unintentional    Governor RooksLord, Charrise Lardner, MD 08/19/17 [phone]

## 2017-08-19 NOTE — ED Triage Notes (Signed)
Pt c/o upper back pain that radiates into the lower back since working on Saturday. Pt denies injury but has Hx/o back problems. Pt moving slowly and pt hollering when moving in certain directions. Pt taken to room 18 for further evaluation.

## 2017-08-19 NOTE — ED Notes (Signed)
Pt reports back spasms since Saturday; worse with any movement; denies injury; pain started in his neck and has moved down his entire back; has taken no medication for his symptoms; pt with history of back pain; has been prescribed Mobic and norflex by an unknown provider but not taking;

## 2017-08-30 ENCOUNTER — Ambulatory Visit
Admission: EM | Admit: 2017-08-30 | Discharge: 2017-08-30 | Disposition: A | Discharge status: Home / Self Care | Payer: 59 | Attending: Family Medicine | Admitting: Family Medicine

## 2017-08-30 ENCOUNTER — Other Ambulatory Visit: Payer: Self-pay

## 2017-08-30 DIAGNOSIS — K047 Periapical abscess without sinus: Secondary | ICD-10-CM | POA: Diagnosis not present

## 2017-08-30 MED ORDER — IBUPROFEN 800 MG PO TABS: 800.0000 mg | ORAL_TABLET | Freq: Three times a day (TID) | ORAL | 0 refills | Status: DC | PRN

## 2017-08-30 MED ORDER — AMOXICILLIN-POT CLAVULANATE 875-125 MG PO TABS: 1.0000 | ORAL_TABLET | Freq: Two times a day (BID) | ORAL | 0 refills | Status: DC

## 2017-08-30 NOTE — Discharge Instructions (Signed)
Antibiotic as prescribed.  Be sure to see your dentist ASAP.  Take care  Dr. Adriana Simasook

## 2017-08-30 NOTE — ED Provider Notes (Signed)
[facility]    CSN: [id number] Arrival date & time: 08/30/17  0813  History   Chief Complaint Chief Complaint  Patient presents with  . Dental Problem   HPI  51 year old male presents with a dental issue.  Patient reports a 2-day history of dental pain.  Patient states that he saw his dentist approximately a year ago.  At that time he was told that this tooth needed to be pulled.  He states that he was trying to wait until after the holiday season to have it pulled.  2 days ago he developed pain in the area.  Right lower molar.  Pain has progressed and now he is developed facial swelling.  No associated fever.  Pain is severe.  He feels that it is infected.  He has been taking ibuprofen without resolution.  No other associated symptoms.  No other complaints this time.  Past Medical History:  Diagnosis Date  . Anxiety   . Chronic back pain   . Hypertension    has a prescription but has not had it filled.  Marland Kitchen. Umbilical hernia    has had surgery to "patch" it    Patient Active Problem List   Diagnosis Date Noted  . Alcohol dependence (HCC) 01/21/2017  . Anxiety 01/21/2017  . Chronic back pain 01/21/2017  . Hypertension 01/21/2017  . Seasonal allergies 01/21/2017    Past Surgical History:  Procedure Laterality Date  . ANTERIOR CERVICAL DECOMP/DISCECTOMY FUSION N/A 07/16/2013   Procedure: ANTERIOR CERVICAL DECOMPRESSION/DISCECTOMY FUSION CERVICAL THREE-FOUR,FOUR-FIVE AND CERVICAL SIX-SEVEN;  Surgeon: Reinaldo Meekerandy O Kritzer, MD;  Location: [facility];  Service: Neurosurgery;  Laterality: N/A;  . BACK SURGERY    . ESOPHAGOGASTRODUODENOSCOPY N/A 01/04/2017   Procedure: ESOPHAGOGASTRODUODENOSCOPY (EGD);  Surgeon: Toney Reilohini Reddy Vanga, MD;  Location: [facility] ENDOSCOPY;  Service: Gastroenterology;  Laterality: N/A;  . HERNIA REPAIR       Home Medications    Prior to Admission medications   Medication Sig Start Date End Date Taking? Authorizing Provider    amoxicillin-clavulanate (AUGMENTIN) 875-125 MG tablet Take 1 tablet by mouth every 12 (twelve) hours. 08/30/17   Tommie Samsook, Arlita Buffkin G, DO  cyclobenzaprine (FLEXERIL) 10 MG tablet Take 1 tablet (10 mg total) by mouth 3 (three) times daily as needed for muscle spasms. 08/19/17   Governor RooksLord, Rebecca, MD  ibuprofen (ADVIL,MOTRIN) 800 MG tablet Take 1 tablet (800 mg total) by mouth every 8 (eight) hours as needed. 08/30/17   Tommie Samsook, Ceairra Mccarver G, DO    Family History Family History  Problem Relation Age of Onset  . Arthritis Mother     Social History Social History   Tobacco Use  . Smoking status: Current Every Day Smoker    Packs/day: 0.50    Types: Cigarettes  . Smokeless tobacco: Never Used  Substance Use Topics  . Alcohol use: Yes    Alcohol/week: 2.4 oz    Types: 4 Cans of beer per week    Comment: six pack of beer/day  . Drug use: No     Allergies   Patient has no known allergies.   Review of Systems Review of Systems  Constitutional: Negative for chills and fever.  HENT: Positive for dental problem.        Swelling - R lower Jaw.   Physical Exam Triage Vital Signs ED Triage Vitals  Enc Vitals Group     BP 08/30/17 0822 123/74     Pulse Rate 08/30/17 0822 72     Resp 08/30/17  [id number] 18     Temp 08/30/17 0822 98.4 F (36.9 C)     Temp Source 08/30/17 0822 Oral     SpO2 08/30/17 0822 100 %     Weight 08/30/17 0824 205 lb (93 kg)     Height 08/30/17 0824 6\' 2"  (1.88 m)     Head Circumference --      Peak Flow --      Pain Score 08/30/17 0824 8     Pain Loc --      Pain Edu? --      Excl. in GC? --    Updated Vital Signs BP 123/74 (BP Location: Right Arm)   Pulse 72   Temp 98.4 F (36.9 C) (Oral)   Resp 18   Ht 6\' 2"  (1.88 m)   Wt 205 lb (93 kg)   SpO2 100%   BMI 26.32 kg/m   Physical Exam  Constitutional: He is oriented to person, place, and time. He appears well-developed. No distress.  HENT:  Mouth/Throat:    Labeled tooth with dental caries which is eroded the  tooth.  Overlying erythema.  Likely has underlying abscess.  Tender to touch with a tongue blade.  Edema and erythema of the right lower jaw.  Neck: Neck supple.  Cardiovascular: Normal rate and regular rhythm.  No murmur heard. Pulmonary/Chest: Effort normal and breath sounds normal. He has no wheezes. He has no rales.  Lymphadenopathy:    He has no cervical adenopathy.  Neurological: He is alert and oriented to person, place, and time.  Psychiatric: He has a normal mood and affect. His behavior is normal.  Nursing note and vitals reviewed.  UC Treatments / Results  Labs (all labs ordered are listed, but only abnormal results are displayed) Labs Reviewed - No data to display  EKG  EKG Interpretation None       Radiology No results found.  Procedures Procedures (including critical care time)  Medications Ordered in UC Medications - No data to display   Initial Impression / Assessment and Plan / UC Course  I have reviewed the triage vital signs and the nursing notes.  Pertinent labs & imaging results that were available during my care of the patient were reviewed by me and considered in my medical decision making (see chart for details).     51 year old male presents with a dental infection.  Suspect abscess.  Treating with Augmentin.  Ibuprofen as needed for pain.  I advised him to see his dentist as soon as possible.  Final Clinical Impressions(s) / UC Diagnoses   Final diagnoses:  Dental infection    ED Discharge Orders        Ordered    ibuprofen (ADVIL,MOTRIN) 800 MG tablet  Every 8 hours PRN     08/30/17 0851    amoxicillin-clavulanate (AUGMENTIN) 875-125 MG tablet  Every 12 hours     08/30/17 0851     Controlled Substance Prescriptions [place] Controlled Substance Registry consulted? Not Applicable   Tommie SamsCook, Lyzbeth Genrich G, [place]DO 08/30/17 [id number]

## 2017-08-30 NOTE — ED Triage Notes (Signed)
Pt with 2 days of right lower dental pain and facial swelling. "I think it's infected." and "I think my tooth is cracked." Pain 8/10

## 2017-11-18 ENCOUNTER — Telehealth: Payer: Self-pay | Admitting: Gastroenterology

## 2017-11-18 NOTE — Telephone Encounter (Signed)
Patient called stating he had eaten steak about 4ish & drank some water & feels it has gone down.He will continue to drink & monitor it.He has seen Dr Tristan Thompson for this 05-2017.Ginger spoke to the patient.If this becomes worse he will go to the ED.Please call & check on.

## 2017-11-19 NOTE — Telephone Encounter (Signed)
ERROR

## 2017-12-02 ENCOUNTER — Encounter: Payer: Self-pay | Admitting: Gastroenterology

## 2017-12-02 ENCOUNTER — Ambulatory Visit: Payer: 59 | Admitting: Gastroenterology

## 2017-12-17 ENCOUNTER — Encounter: Payer: Self-pay | Admitting: Emergency Medicine

## 2017-12-17 ENCOUNTER — Other Ambulatory Visit: Payer: Self-pay

## 2017-12-17 ENCOUNTER — Ambulatory Visit
Admission: EM | Admit: 2017-12-17 | Discharge: 2017-12-17 | Disposition: A | Discharge status: Home / Self Care | Payer: 59 | Attending: Family Medicine | Admitting: Family Medicine

## 2017-12-17 DIAGNOSIS — K0889 Other specified disorders of teeth and supporting structures: Secondary | ICD-10-CM | POA: Diagnosis not present

## 2017-12-17 MED ORDER — AMOXICILLIN-POT CLAVULANATE 875-125 MG PO TABS: 1.0000 | ORAL_TABLET | Freq: Two times a day (BID) | ORAL | 0 refills | Status: DC

## 2017-12-17 NOTE — Discharge Instructions (Signed)
Take medication as prescribed. Rest. Drink plenty of fluids.   Follow up with Dentist as planned.  Follow up with your primary care physician this week as needed. Return to Urgent care for new or worsening concerns.

## 2017-12-17 NOTE — ED Triage Notes (Signed)
Patient states his mouth is swollen and started yesterday.  Patient states he thinks it is an abcess and he has an appointment with dentist in 2 weeks

## 2017-12-17 NOTE — ED Provider Notes (Signed)
[facility] ____________________________________________  Time seen: Approximately 7:50 PM  I have reviewed the triage vital signs and the nursing notes.   HISTORY  Chief Complaint Dental Pain    HPI Tristan Thompson is a 52 y.o. male presented for evaluation of right lower dental pain present since yesterday.  States he has a known fractured tooth in this area that has had a previous infection.  States he has an appointment with his dentist for dental extraction in 2 weeks.denies trigger or trauma.  Has taken some over-the-counter ibuprofen which has not helped a lot.  States pain currently is very mild, and states does not want to allow to get as bad as previous dental infection which was end paresthesias, pain radiation, facial swelling, of 2018.  Rash, skin changes.  Reports continues to eat and drink well.  Denies other aggravating or alleviating factors.   Past Medical History:  Diagnosis Date  . Anxiety   . Chronic back pain   . Hypertension    has a prescription but has not had it filled.  Marland Kitchen Umbilical hernia    has had surgery to "patch" it    Patient Active Problem List   Diagnosis Date Noted  . Alcohol dependence (HCC) 01/21/2017  . Anxiety 01/21/2017  . Chronic back pain 01/21/2017  . Hypertension 01/21/2017  . Seasonal allergies 01/21/2017    Past Surgical History:  Procedure Laterality Date  . ANTERIOR CERVICAL DECOMP/DISCECTOMY FUSION N/A 07/16/2013   Procedure: ANTERIOR CERVICAL DECOMPRESSION/DISCECTOMY FUSION CERVICAL THREE-FOUR,FOUR-FIVE AND CERVICAL SIX-SEVEN;  Surgeon: Reinaldo Meeker, MD;  Location: [facility];  Service: Neurosurgery;  Laterality: N/A;  . BACK SURGERY    . ESOPHAGOGASTRODUODENOSCOPY N/A 01/04/2017   Procedure: ESOPHAGOGASTRODUODENOSCOPY (EGD);  Surgeon: Toney Reil, MD;  Location: [facility] ENDOSCOPY;  Service: Gastroenterology;  Laterality: N/A;  . HERNIA REPAIR      Current Outpatient Rx  . Order #: [id number] Class:  Normal    Allergies Patient has no known allergies.  Family History  Problem Relation Age of Onset  . Arthritis Mother   . Hypertension Father     Social History Social History   Tobacco Use  . Smoking status: Current Every Day Smoker    Packs/day: 0.50    Types: Cigarettes  . Smokeless tobacco: Never Used  Substance Use Topics  . Alcohol use: Yes    Alcohol/week: 2.4 oz    Types: 4 Cans of beer per week    Comment: six pack of beer/day  . Drug use: No    Review of Systems Constitutional: No fever/chills. Reports continues to eat and drink foods and fluids well.  ENT: No sore throat. Cardiovascular: Denies chest pain. Respiratory: Denies shortness of breath. Gastrointestinal: No abdominal pain.  No nausea, no vomiting.  Genitourinary: Negative for dysuria. Musculoskeletal: Negative for back pain. Skin: Negative for rash.   ____________________________________________   PHYSICAL EXAM:  VITAL SIGNS: ED Triage Vitals [12/17/17 1929]  Enc Vitals Group     BP (!) 147/82     Pulse Rate 65     Resp 16     Temp 98.4 F (36.9 C)     Temp Source Oral     SpO2 98 %     Weight 205 lb (93 kg)     Height 6' (1.829 m)     Head Circumference      Peak Flow      Pain Score 5     Pain Loc  Pain Edu?      Excl. in GC?     Constitutional: Alert and oriented. Well appearing and in no acute distress. Eyes: Conjunctivae are normal. PERRL. EOMI. Head: Atraumatic.No erythema or edema noted. Ears: Bilateral ears no erythema, normal TMs.  Nose: No congestion/rhinnorhea. Mouth/Throat: Mucous membranes are moist.  Oropharynx non-erythematous. Periodontal Exam     Multiple dental caries noted.  Fractured tooth to right lower as depicted above with tenderness along tooth #30 and 31, no palpable or visible dental abscess noted, mild gum erythema and swelling noted, no facial swelling, no induration.  Neck: No stridor.  Hematological/Lymphatic/Immunilogical: No cervical  lymphadenopathy. Cardiovascular:   Normal rate, regular rhythm. Grossly normal heart sounds. Good peripheral circulation. Respiratory: Normal respiratory effort.  No retractions. Musculoskeletal: Steady gait.  Neurologic:  Normal speech and language. Speech is normal. No gait instability. Skin:  Skin is warm, dry and intact. No rash noted. Psychiatric: Mood and affect are normal. Speech and behavior are normal.  ____________________________________________   LABS (all labs ordered are listed, but only abnormal results are displayed)  Labs Reviewed - No data to display ____________________________________________    INITIAL IMPRESSION / ASSESSMENT AND PLAN / ED COURSE  Pertinent labs & imaging results that were available during my care of the patient were reviewed by me and considered in my medical decision making (see chart for details).  Well-appearing patient.  No acute distress.  Pressure 2005, suspect secondary infection.  Will treat patient with oral Augmentin, continue over-the-counter Tylenol and ibuprofen.  Encouraged rest, fluids, supportive care and soft food diet.  Keep a dentist appointment as scheduled. Discussed indication, risks and benefits of medications with patient.   Also advised to take the antibiotic until finished. Instructed to return to the Urgent Care or ER  for symptoms that change or worsen or if unable to schedule an appointment.  Patient agreed with this plan. ____________________________________________   FINAL CLINICAL IMPRESSION(S) / ED DIAGNOSES  Final diagnoses:  Pain, dental         Renford DillsMiller, Nancee Brownrigg, NP 12/17/17 2011

## 2018-09-15 DIAGNOSIS — R062 Wheezing: Secondary | ICD-10-CM | POA: Diagnosis not present

## 2018-09-15 DIAGNOSIS — J0100 Acute maxillary sinusitis, unspecified: Secondary | ICD-10-CM | POA: Diagnosis not present

## 2019-07-02 ENCOUNTER — Other Ambulatory Visit: Payer: Self-pay

## 2019-07-02 ENCOUNTER — Encounter: Payer: Self-pay | Admitting: Emergency Medicine

## 2019-07-02 ENCOUNTER — Ambulatory Visit
Admission: EM | Admit: 2019-07-02 | Discharge: 2019-07-02 | Disposition: A | Discharge status: Home / Self Care | Payer: 59 | Attending: Urgent Care | Admitting: Urgent Care

## 2019-07-02 DIAGNOSIS — M5441 Lumbago with sciatica, right side: Secondary | ICD-10-CM | POA: Diagnosis not present

## 2019-07-02 MED ORDER — TRAMADOL HCL 50 MG PO TABS: 50.0000 mg | ORAL_TABLET | Freq: Two times a day (BID) | ORAL | 0 refills | Status: DC | PRN

## 2019-07-02 MED ORDER — METHYLPREDNISOLONE 4 MG PO TBPK: ORAL_TABLET | ORAL | 0 refills | Status: DC

## 2019-07-02 NOTE — ED Triage Notes (Signed)
Patient c/o lower back for 5 years.  Patient states that it has gradually gotten worse.  Patient states that he was told he had a bulging disc.

## 2019-07-02 NOTE — Discharge Instructions (Signed)
It was very nice seeing you today in clinic. Thank you for entrusting me with your care.   Rest and apply heat. Please utilize the medications that we discussed. Your prescriptions has been called in to your pharmacy.   Make arrangements to follow up with your regular doctor in 1 week for re-evaluation if not improving. If your symptoms/condition worsens, please seek follow up care either here or in the ER. Please remember, our [place] providers are "right here with you" when you need [place].   Again, it was my pleasure to take care of you today. Thank you for choosing our clinic. I hope that you start to feel better quickly.   Honor Loh, MSN, APRN, FNP-C, CEN Advanced Practice Provider [facility]

## 2019-07-03 NOTE — ED Provider Notes (Addendum)
[place], [place]   Name: Tristan Thompson DOB: 1966-04-03 MRN: [id number] CSN: [id number] PCP: Patient, No Pcp Per  Arrival date and time:  07/02/19 1201  Chief Complaint:  Back Pain   NOTE: Prior to seeing the patient today, I have reviewed the triage nursing documentation and vital signs. Clinical staff has updated patient's PMH/PSHx, current medication list, and drug allergies/intolerances to ensure comprehensive history available to assist in medical decision making.   History:   HPI: Tristan Thompson is a 53 y.o. male who presents today with complaints of an acute exacerbation of his chronic back pain. PMH (+) for lowe back pain that patient reports is due to osteoarthritis and a bulging disc that he has had for approximately the last 5 years.  Patient with pain in the RIGHT side of his lower back that flared up "a few days ago"; describes as a "dull burning". Patient denies recent injury/trauma. He advises that he has a physically demanding job that has required him to do more heavy lifting as of late. He denies feeling/hearing any sort of popping in his back. He has not experienced any concurrent urinary symptoms. Patient endorses radiation of his pain into posterior aspect of his RIGHT lower extremity; describes as a "shocking" sensation. He has not appreciated any weakness or distal paraesthesias in his legs. Patient denies any acute issues with bowel or bladder incontinence. No saddle anesthesia. Patient notes that pain is exacerbated by prolong sitting/standing, bending, and torso twisting. He has attempted conservative management at home using IBU, which has done little to improve his symptoms. PMH (+) for previous neck and back surgeries.  Past Medical History:  Diagnosis Date  . Anxiety   . Chronic back pain   . Hypertension    has a prescription but has not had it filled.  Marland Kitchen Umbilical hernia    has had surgery to "patch" it    Past Surgical History:  Procedure Laterality Date   . ANTERIOR CERVICAL DECOMP/DISCECTOMY FUSION N/A 07/16/2013   Procedure: ANTERIOR CERVICAL DECOMPRESSION/DISCECTOMY FUSION CERVICAL THREE-FOUR,FOUR-FIVE AND CERVICAL SIX-SEVEN;  Surgeon: Reinaldo Meeker, MD;  Location: [facility];  Service: Neurosurgery;  Laterality: N/A;  . BACK SURGERY    . ESOPHAGOGASTRODUODENOSCOPY N/A 01/04/2017   Procedure: ESOPHAGOGASTRODUODENOSCOPY (EGD);  Surgeon: Toney Reil, MD;  Location: [facility] ENDOSCOPY;  Service: Gastroenterology;  Laterality: N/A;  . HERNIA REPAIR      Family History  Problem Relation Age of Onset  . Arthritis Mother   . Hypertension Father     Social History   Tobacco Use  . Smoking status: Current Every Day Smoker    Packs/day: 0.50    Types: Cigarettes  . Smokeless tobacco: Never Used  Substance Use Topics  . Alcohol use: Yes    Alcohol/week: 4.0 standard drinks    Types: 4 Cans of beer per week    Comment: six pack of beer/day  . Drug use: No    Patient Active Problem List   Diagnosis Date Noted  . Alcohol dependence (HCC) 01/21/2017  . Anxiety 01/21/2017  . Chronic back pain 01/21/2017  . Hypertension 01/21/2017  . Seasonal allergies 01/21/2017    Home Medications:    No outpatient medications have been marked as taking for the 07/02/19 encounter [facility] Encounter).    Allergies:   Patient has no known allergies.  Review of Systems (ROS): Review of Systems  Constitutional: Negative for chills and fever.  Respiratory: Negative for cough and shortness of breath.  Cardiovascular: Negative for chest pain and palpitations.  Genitourinary: Negative for dysuria, frequency, hematuria and urgency.  Musculoskeletal: Positive for back pain. Negative for gait problem, neck pain and neck stiffness.  All other systems reviewed and are negative.    Vital Signs: Today's Vitals   07/02/19 1218 07/02/19 1220 07/02/19 1316  BP:  (!) 151/102   Pulse:  65   Resp:  16   Temp:  98.5 F (36.9 C)   TempSrc:  Oral    SpO2:  100%   Weight: 212 lb (96.2 kg)    Height:  (1.88 m)    PainSc: 8   8     Physical Exam: Physical Exam  Constitutional: He is oriented to person, place, and time and well-developed, well-nourished, and in no distress.  HENT:  Head: Normocephalic and atraumatic.  Mouth/Throat: Mucous membranes are normal.  Eyes: Pupils are equal, round, and reactive to light. EOM are normal.  Neck: Normal range of motion and full passive range of motion without pain. Neck supple.  Cardiovascular: Normal rate, regular rhythm, normal heart sounds and intact distal pulses. Exam reveals no gallop and no friction rub.  No murmur heard. Pulmonary/Chest: Effort normal and breath sounds normal. No respiratory distress. He has no wheezes. He has no rales.  Musculoskeletal:     Lumbar back: He exhibits tenderness (RIGHT paralumbar muscle group and overlying SI joint). He exhibits no edema, no deformity and no spasm.       Back:     Comments: No midline pain or gross deformities.   Neurological: He is alert and oriented to person, place, and time. Gait normal.  Skin: Skin is warm and dry. No rash noted.  Psychiatric: Mood, memory, affect and judgment normal.  Nursing note and vitals reviewed.   Urgent Care Treatments / Results:   LABS: PLEASE NOTE: all labs that were ordered this encounter are listed, however only abnormal results are displayed. Labs Reviewed - No data to display  EKG: -None  RADIOLOGY: No results found.  PROCEDURES: Procedures  MEDICATIONS RECEIVED THIS VISIT: Medications - No data to display  PERTINENT CLINICAL COURSE NOTES/UPDATES:   Initial Impression / Assessment and Plan / Urgent Care Course:  Pertinent labs & imaging results that were available during my care of the patient were personally reviewed by me and considered in my medical decision making (see lab/imaging section of note for values and interpretations).  Tristan Thompson is a 53 y.o. male who  presents to [facility] today with complaints of Back Pain   Patient is well appearing overall in clinic today. He does not appear to be in any acute distress. Presenting symptoms (see HPI) and exam as documented above.  Exam consistent with flare of known lower back pain and sciatica.  Patient reports known osteoarthritis in his back.  He makes mention of bulging disc.  Review of MRI done in 2014 reveals a broad-based disc bulge at T8-9 level.  Diagnostic plain films done in 2018 revealed preserved vertebral body height and alignment in the thoracic spine.  In the absence of injury, repeat diagnostic plain films are felt to be of little benefit at this point.  Patient has no gross neurological symptoms.  He reports frequent flares of his sciatica. Etiology of pain likely secondary to recent increased heavy lifting.  Patient has been taking ibuprofen at home with no relief.  Will add Medrol Dosepak to help with his symptoms.  Patient encouraged to apply moist heat for  15 to 20 minutes at a time at least 3-4 times a day as a complementary approach to relieving his back pain.  Patient encouraged to rest and avoid heavy lifting, bending, and twisting. Discussed stretching as tolerated. Patient reporting pain that is preventing him from sleeping at times.  Will provide a short term course of tramadol for as needed use.  Indications and side effects of this medication reviewed with patient.    Current clinical condition warrants patient being out of work in order to recover from his current injury/illness. He was provided with the appropriate documentation to provide to his place of employment that will allow for him to RTW on 07/05/2019 with no restrictions.   If pain persists, patient encouraged to follow-up with PCP or neurosurgery for consideration of repeat MRI. I have reviewed the follow up and strict return precautions for any new or worsening symptoms. Patient is aware of symptoms that would be  deemed urgent/emergent, and would thus require further evaluation either here or in the emergency department. At the time of discharge, he verbalized understanding and consent with the discharge plan as it was reviewed with him. All questions were fielded by provider and/or clinic staff prior to patient discharge.    Final Clinical Impressions / Urgent Care Diagnoses:   Final diagnoses:  Right-sided low back pain with right-sided sciatica, unspecified chronicity    New Prescriptions:  [place] Controlled Substance Registry consulted? Yes, I have consulted the [place] Controlled Substances Registry for this patient, and feel the risk/benefit ratio today is favorable for proceeding with this prescription for a controlled substance.  . Discussed use of controlled substance medication to treat his acute pain.  o Reviewed [place] STOP Act regulations  o Clinic does not refill controlled substances over the phone without face to face evaluation.  . Safety precautions reviewed.  o Medications should not be bitten, chewed, sold, or taken with alcohol.  o Avoid use while working, driving, or operating heavy machinery.  o Side effects associated with the use of this particular medication reviewed. - Patient understands that this medication can cause CNS depression, increase his risk of falls, and even lead to overdose that may result in death, if used outside of the parameters that he and I discussed.  With all of this in mind, he knowingly accepts the risks and responsibilities associated with intended course of treatment, and elects to responsibly proceed as discussed.  Meds ordered this encounter  Medications  . methylPREDNISolone (MEDROL DOSEPAK) 4 MG TBPK tablet    Sig: Take by mouth daily - taper daily dose per package instructions.    Dispense:  21 tablet    Refill:  0  . traMADol (ULTRAM) 50 MG tablet    Sig: Take 1 tablet (50 mg total) by mouth every 12 (twelve) hours as needed.    Dispense:  10 tablet     Refill:  0    Recommended Follow up Care:  Patient encouraged to follow up with the following provider within the specified time frame, or sooner as dictated by the severity of his symptoms. As always, he was instructed that for any urgent/emergent care needs, he should seek care either here or in the emergency department for more immediate evaluation.  Follow-up Information    Kritzer, Louie Casa, MD In 1 week.   Specialty: Neurosurgery Why: General reassessment of symptoms if not improving Contact information: [address] [place] [address] [phone]        PCP  In 1 week.   Why: General reassessment of symptoms if not improving        NOTE: This note was prepared using Scientist, clinical (histocompatibility and immunogenetics)Dragon dictation software along with smaller Lobbyistphrase technology. Despite my best ability to proofread, there is the potential that transcriptional errors may still occur from this process, and are completely unintentional.    Verlee MonteGray, Atina Feeley E, NP 07/03/19 2041

## 2020-03-07 ENCOUNTER — Ambulatory Visit
Admission: EM | Admit: 2020-03-07 | Discharge: 2020-03-07 | Disposition: A | Discharge status: Home / Self Care | Payer: 59 | Attending: Physician Assistant | Admitting: Physician Assistant

## 2020-03-07 ENCOUNTER — Encounter: Payer: Self-pay | Admitting: Emergency Medicine

## 2020-03-07 ENCOUNTER — Other Ambulatory Visit: Payer: Self-pay

## 2020-03-07 DIAGNOSIS — I10 Essential (primary) hypertension: Secondary | ICD-10-CM | POA: Diagnosis not present

## 2020-03-07 DIAGNOSIS — L03119 Cellulitis of unspecified part of limb: Secondary | ICD-10-CM | POA: Diagnosis not present

## 2020-03-07 DIAGNOSIS — I878 Other specified disorders of veins: Secondary | ICD-10-CM | POA: Diagnosis not present

## 2020-03-07 DIAGNOSIS — R0989 Other specified symptoms and signs involving the circulatory and respiratory systems: Secondary | ICD-10-CM

## 2020-03-07 DIAGNOSIS — I8393 Asymptomatic varicose veins of bilateral lower extremities: Secondary | ICD-10-CM

## 2020-03-07 MED ORDER — DOXYCYCLINE HYCLATE 100 MG PO CAPS: 100.0000 mg | ORAL_CAPSULE | Freq: Two times a day (BID) | ORAL | 0 refills | Status: AC

## 2020-03-07 MED ORDER — AMLODIPINE BESYLATE 5 MG PO TABS: 5.0000 mg | ORAL_TABLET | Freq: Every day | ORAL | 0 refills | Status: DC

## 2020-03-07 NOTE — Discharge Instructions (Signed)
-  Doxycycline: 1 tablet twice a day for 10 days -Amlodipine: 1 tablet daily -Recommend compression stockings while at work or on your feet for prolonged period -Follow with primary care provider for further long-term management

## 2020-03-07 NOTE — ED Triage Notes (Signed)
Pt c/o bilateral leg swelling and redness. Started about 2 weeks ago. Denies shortness of breath.

## 2020-03-07 NOTE — ED Provider Notes (Signed)
[facility]    CSN: [id number] Arrival date & time: 03/07/20  1124      History   Chief Complaint Chief Complaint  Patient presents with  . Leg Swelling    bilateral    HPI Tristan Thompson is a 54 y.o. male.   Patient is a 54 year old male who presents with chief complaint of swelling and redness to his lower legs. 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  Patient denies any chest pain, shortness of breath.  Denies any pain or itchiness to the area that is swollen and red.  Patient denies any heart or vascular issues with him or his family.  He does have a history of hypertension but has has not filled a prescription that he was given.     Past Medical History:  Diagnosis Date  . Anxiety   . Chronic back pain   . Hypertension    has a prescription but has not had it filled.  Marland Kitchen Umbilical hernia    has had surgery to "patch" it    Patient Active Problem List   Diagnosis Date Noted  . Alcohol dependence (HCC) 01/21/2017  . Anxiety 01/21/2017  . Chronic back pain 01/21/2017  . Hypertension 01/21/2017  . Seasonal allergies 01/21/2017    Past Surgical History:  Procedure Laterality Date  . ANTERIOR CERVICAL DECOMP/DISCECTOMY FUSION N/A 07/16/2013   Procedure: ANTERIOR CERVICAL DECOMPRESSION/DISCECTOMY FUSION CERVICAL THREE-FOUR,FOUR-FIVE AND CERVICAL SIX-SEVEN;  Surgeon: Reinaldo Meeker, MD;  Location: [facility];  Service: Neurosurgery;  Laterality: N/A;  . BACK SURGERY    . ESOPHAGOGASTRODUODENOSCOPY N/A 01/04/2017   Procedure: ESOPHAGOGASTRODUODENOSCOPY (EGD);  Surgeon: Toney Reil, MD;  Location: [facility] ENDOSCOPY;  Service: Gastroenterology;  Laterality: N/A;  . HERNIA REPAIR         Home Medications    Prior to Admission medications   Medication Sig Start Date  End Date Taking? Authorizing Provider  amLODipine (NORVASC) 5 MG tablet Take 1 tablet (5 mg total) by mouth daily. 03/07/20   Candis Schatz, PA-C  doxycycline (VIBRAMYCIN) 100 MG capsule Take 1 capsule (100 mg total) by mouth 2 (two) times daily for 10 days. 03/07/20 03/17/20  Candis Schatz, PA-C    Family History Family History  Problem Relation Age of Onset  . Arthritis Mother   . Hypertension Father     Social History Social History   Tobacco Use  . Smoking status: Current Every Day Smoker    Packs/day: 0.50    Types: Cigarettes  . Smokeless tobacco: Never Used  Vaping Use  . Vaping Use: Never used  Substance Use Topics  . Alcohol use: Yes    Alcohol/week: 4.0 standard drinks    Types: 4 Cans of beer per week    Comment: six pack of beer/day  . Drug use: No     Allergies   Patient has no known allergies.   Review of Systems Review of Systems as noted in HPI.  Other system reviewed and found to be negative   Physical Exam Triage Vital Signs ED Triage Vitals  Enc Vitals Group     BP 03/07/20 1146 (!) 166/88     Pulse Rate 03/07/20 1146 65     Resp 03/07/20 1146 18     Temp 03/07/20 1146  98.5 F (36.9 C)     Temp Source 03/07/20 1146 Oral     SpO2 03/07/20 1146 100 %     Weight 03/07/20 1144 210 lb (95.3 kg)     Height 03/07/20 1144 6\' 2"  (1.88 m)     Head Circumference --      Peak Flow --      Pain Score 03/07/20 1144 0     Pain Loc --      Pain Edu? --      Excl. in GC? --    No data found.  Updated Vital Signs BP (!) 166/88 (BP Location: Left Arm)   Pulse 65   Temp 98.5 F (36.9 C) (Oral)   Resp 18   Ht 6\' 2"  (1.88 m)   Wt 210 lb (95.3 kg)   SpO2 100%   BMI 26.96 kg/m   Physical Exam Constitutional:      Appearance: Normal appearance.  Eyes:     Pupils: Pupils are equal, round, and reactive to light.  Cardiovascular:     Rate and Rhythm: Normal rate.     Pulses: Normal pulses.  Pulmonary:     Effort: Pulmonary effort is normal.     Skin:    General: Skin is warm and dry.     Capillary Refill: Capillary refill takes less than 2 seconds.          Comments: Bilateral lower leg edema and erythema.  Not tender to touch.  Nonblanchable.  Good cap refill distally.  Varicose veins noted to both lower legs.  Minimal fever to the areas of erythema.  Bilateral venous stasis changes noted.  Neurological:     Mental Status: He is alert.    Left Leg   Right   UC Treatments / Results  Labs (all labs ordered are listed, but only abnormal results are displayed) Labs Reviewed - No data to display  EKG   Radiology No results found.  Procedures Procedures (including critical care time)  Medications Ordered in UC Medications - No data to display  Initial Impression / Assessment and Plan / UC Course  I have reviewed the triage vital signs and the nursing notes.  Pertinent labs & imaging results that were available during my care of the patient were reviewed by me and considered in my medical decision making (see chart for details).    Patient with a history of untreated hypertension.  Had a recent position change at work where he is now walking possibly 3-4 miles a day at work, 6 days a week.  Erythema and redness is developed over the same time period of about 2 weeks.  Patient has noted venous stasis changes bilaterally as well as varicose veins bilaterally.  Minimal fever to the erythema but coloration is concerning for developing cellulitis.  We will give him prescription for doxycycline and recommend compression stockings while working on his feet.-Follow-up with primary care provider for further evaluation and management.  Final Clinical Impressions(s) / UC Diagnoses   Final diagnoses:  Cellulitis of lower extremity, unspecified laterality  Venous stasis  Varicose veins of both lower extremities, unspecified whether complicated  Essential hypertension  Poor circulation of extremity     Discharge  Instructions     -Doxycycline: 1 tablet twice a day for 10 days -Amlodipine: 1 tablet daily -Recommend compression stockings while at work or on your feet for prolonged period -Follow with primary care provider for further long-term management    ED Prescriptions  Medication Sig Dispense Auth. Provider   doxycycline (VIBRAMYCIN) 100 MG capsule Take 1 capsule (100 mg total) by mouth 2 (two) times daily for 10 days. 20 capsule Candis Schatz, PA-C   amLODipine (NORVASC) 5 MG tablet Take 1 tablet (5 mg total) by mouth daily. 90 tablet Candis Schatz, PA-C     PDMP not reviewed this encounter.   Candis Schatz, PA-C 03/07/20 1247

## 2020-04-25 ENCOUNTER — Encounter: Payer: Self-pay | Admitting: Emergency Medicine

## 2020-04-25 ENCOUNTER — Ambulatory Visit
Admission: EM | Admit: 2020-04-25 | Discharge: 2020-04-25 | Disposition: A | Discharge status: Home / Self Care | Payer: 59 | Attending: Emergency Medicine | Admitting: Emergency Medicine

## 2020-04-25 ENCOUNTER — Other Ambulatory Visit: Payer: Self-pay

## 2020-04-25 DIAGNOSIS — F1020 Alcohol dependence, uncomplicated: Secondary | ICD-10-CM

## 2020-04-25 DIAGNOSIS — I10 Essential (primary) hypertension: Secondary | ICD-10-CM

## 2020-04-25 DIAGNOSIS — F17210 Nicotine dependence, cigarettes, uncomplicated: Secondary | ICD-10-CM | POA: Diagnosis not present

## 2020-04-25 DIAGNOSIS — Z981 Arthrodesis status: Secondary | ICD-10-CM | POA: Insufficient documentation

## 2020-04-25 DIAGNOSIS — Z20822 Contact with and (suspected) exposure to covid-19: Secondary | ICD-10-CM | POA: Diagnosis present

## 2020-04-25 DIAGNOSIS — Z79899 Other long term (current) drug therapy: Secondary | ICD-10-CM | POA: Insufficient documentation

## 2020-04-25 DIAGNOSIS — R600 Localized edema: Secondary | ICD-10-CM

## 2020-04-25 DIAGNOSIS — Z760 Encounter for issue of repeat prescription: Secondary | ICD-10-CM

## 2020-04-25 DIAGNOSIS — R519 Headache, unspecified: Secondary | ICD-10-CM | POA: Insufficient documentation

## 2020-04-25 LAB — SARS CORONAVIRUS 2 (TAT 6-24 HRS): SARS Coronavirus 2: NEGATIVE

## 2020-04-25 MED ORDER — AMLODIPINE BESYLATE 5 MG PO TABS: 5.0000 mg | ORAL_TABLET | Freq: Every day | ORAL | 0 refills | Status: DC

## 2020-04-25 NOTE — ED Provider Notes (Signed)
HPI  SUBJECTIVE:  Tristan Thompson is a 54 y.o. male who presents with several issues today.    First, he is requesting Covid testing.  States that he has had symptoms for the past week including nasal congestion, scratchy throat and a headache last week.  Most of these have resolved.  He reports postnasal drip and an occasional nonproductive cough.  No body aches, fevers, shortness of breath, nausea, vomiting, diarrhea, abdominal pain.  He was exposed to Covid at work about 2 weeks ago.  He got both doses of the Pfizer vaccine, last dose was in March.  He denies allergy symptoms.  He tried Tylenol with some improvement in his headache.  Symptoms are worse with exposure to smoke at work.  No antipyretic in the past 6 hours.    Second, he is requesting a refill of amlodipine 5 mg daily that was prescribed here.  He states that he accidentally washed his blood pressure medication in the laundry he.  He has not taken it in 3 to 4 days.  He is not sure if it has been working well for him.  He does not measure his blood pressure at home.  Third, he reports bilateral lower extremity edema that has been present "for a long time".  It is not changed today.  He has been told to wear compression stockings for this in the past, but has not really done so consistently.  He eats a diet that is high in salt and fast food.  He also states that he drinks at least 6 beers a day, sometimes more in his days off, and that it is "not fun anymore".  He states that he feels "edgy" when he does not drink alcohol.  He has been in the ER for alcohol withdrawal, but did not require admission for delirium tremens.  This occurred when he quit "cold [place]".  He has a past medical history of hypertension, alcohol dependence.  No history of diabetes, coronary disease, HIV, cancer, chronic kidney disease, pulmonary disease, smoking.  PMD: None.  Past Medical History:  Diagnosis Date  . Anxiety   . Chronic back pain   .  Hypertension    has a prescription but has not had it filled.  Marland Kitchen Umbilical hernia    has had surgery to "patch" it    Past Surgical History:  Procedure Laterality Date  . ANTERIOR CERVICAL DECOMP/DISCECTOMY FUSION N/A 07/16/2013   Procedure: ANTERIOR CERVICAL DECOMPRESSION/DISCECTOMY FUSION CERVICAL THREE-FOUR,FOUR-FIVE AND CERVICAL SIX-SEVEN;  Surgeon: Reinaldo Meeker, MD;  Location: [facility];  Service: Neurosurgery;  Laterality: N/A;  . BACK SURGERY    . ESOPHAGOGASTRODUODENOSCOPY N/A 01/04/2017   Procedure: ESOPHAGOGASTRODUODENOSCOPY (EGD);  Surgeon: Toney Reil, MD;  Location: [facility] END[facility]COPY;  Service: Gastroenterology;  Laterality: N/A;  . HERNIA REPAIR      Family History  Problem Relation Age of Onset  . Arthritis Mother   . Hypertension Father     Social History   Tobacco Use  . Smoking status: Current Every Day Smoker    Packs/day: 0.50    Types: Cigarettes  . Smokeless tobacco: Never Used  Vaping Use  . Vaping Use: Never used  Substance Use Topics  . Alcohol use: Yes    Alcohol/week: 4.0 standard drinks    Types: 4 Cans of beer per week    Comment: six pack of beer/day  . Drug use: No    No current facility-administered medications for this encounter.  Current Outpatient Medications:  .  amLODipine (NORVASC) 5 MG tablet, Take 1 tablet (5 mg total) by mouth daily., Disp: 90 tablet, Rfl: 0  No Known Allergies   ROS  As noted in HPI.   Physical Exam  BP (!) 170/93 (BP Location: Left Arm)   Pulse (!) 55   Temp 98.1 F (36.7 C) (Oral)   Resp 18   Ht 6\' 2"  (1.88 m)   Wt 95.3 kg   SpO2 99%   BMI 26.98 kg/m   BP Readings from Last 3 Encounters:  04/25/20 (!) 170/93  03/07/20 (!) 166/88  07/02/19 (!) 151/102     Constitutional: Well developed, well nourished, no acute distress Eyes:  EOMI, conjunctiva normal bilaterally HENT: Normocephalic, atraumatic,mucus membranes moist mild nasal congestion.  No frontal or maxillary sinus  tenderness.  Normal oropharynx.  No obvious postnasal drip. Respiratory: Normal inspiratory effort lungs clear bilaterally Cardiovascular: Regular bradycardia, no murmurs rubs or gallops GI: nondistended,  skin: No rash, skin intact Musculoskeletal: no deformities.  Positive bilateral lower extremity edema.  Patient states that this is not new or different. Neurologic: Alert & oriented x 3, no focal neuro deficits Psychiatric: Speech and behavior appropriate   ED Course   Medications - No data to display  Orders Placed This Encounter  Procedures  . SARS CORONAVIRUS 2 (TAT 6-24 HRS) Nasopharyngeal Nasopharyngeal Swab    Standing Status:   Standing    Number of Occurrences:   1    Order Specific Question:   Is this test for diagnosis or screening    Answer:   Screening    Order Specific Question:   Symptomatic for COVID-19 as defined by CDC    Answer:   No    Order Specific Question:   Hospitalized for COVID-19    Answer:   No    Order Specific Question:   Admitted to ICU for COVID-19    Answer:   No    Order Specific Question:   Previously tested for COVID-19    Answer:   No    Order Specific Question:   Resident in a congregate (group) care setting    Answer:   No    Order Specific Question:   Employed in healthcare setting    Answer:   No    Order Specific Question:   Has patient completed COVID vaccination(s) (2 doses of Pfizer/Moderna 1 dose of 07/04/19)    Answer:   Yes    No results found for this or any previous visit (from the past 24 hour(s)). No results found.  ED Clinical Impression  1. Essential hypertension   2. Medication refill   3. Uncomplicated alcohol dependence (HCC)   4. Bilateral lower extremity edema      ED Assessment/Plan  1.  Hypertension/medication refill pt hypertensive today.  Seems that this is about baseline from the past several visits.  Has not taken blood pressure medications in 3 to 4 days. Pt has no historical evidence of  end organ damage. Pt denies any CNS type sx such as HA, visual changes, focal paresis, or new onset seizure activity. Pt denies any CV sx such as CP, dyspnea, palpitations, worsening pedal edema, tearing pain radiating to back or abd. Pt denied any renal sx such as anuria or hematuria.  Advised patient to cut back on salt and fast food.   will refill his amlodipine 3 months.  He is to keep a log of his blood pressure.  Measure it once a day.  Bring blood pressure log and blood pressure cuff into his next doctor's appointment.  2.  Covid testing.  Will call if positive.  May be candidate for infusion given alcohol dependence.  3.  Alcohol dependence.  Patient is wanting to quit.  He denies having DTs.  Advised gradual cessation, decrease by 1 beer per week.  Will provide information on [facility] for outpatient treatment.  4.  lower extremity edema.  This has been going on for some time.  Do not think that it is a sign of hypertensive emergency.  Advised compression stockings, decrease salt intake.  Primary care list given for ongoing care.  Discussed , MDM, treatment plan, and plan for follow-up with patient. Discussed sn/sx that should prompt return to the ED. patient agrees with plan.   Meds ordered this encounter  Medications  . amLODipine (NORVASC) 5 MG tablet    Sig: Take 1 tablet (5 mg total) by mouth daily.    Dispense:  90 tablet    Refill:  0    *This clinic note was created using Scientist, clinical (histocompatibility and immunogenetics). Therefore, there may be occasional mistakes despite careful proofreading.   ?    Domenick Gong, MD 04/25/20 1302

## 2020-04-25 NOTE — ED Triage Notes (Signed)
Pt c/o headache, runny nose, cough and scratchy throat. Started about a week ago. He states he washed his BP medication in the washing machine and has not had the medication in 3-4 days.

## 2020-04-25 NOTE — Discharge Instructions (Addendum)
°  Decrease your salt and fast food intake. diet and exercise will lower your blood pressure significantly. It is important to keep your blood pressure under good control, as having a elevated blood pressure for prolonged periods of time significantly increases your risk of stroke, heart attacks, kidney damage, eye damage, and other problems. Measure your blood pressure once a day, preferably at the same time every day. Keep a log of this and bring it to your next doctor's appointment.  Bring your blood pressure cuff as well.  Return here in 2 weeks for blood pressure recheck if you're unable to find a primary care physician by then. Return immediately to the ER if you start having chest pain, headache, problems seeing, problems talking, problems walking, if you feel like you're about to pass out, if you do pass out, if you have a seizure, or for any other concerns.  Follow-up with [facility] for help with cutting down on alcohol.  Wear compression stockings for this swelling in your legs.  Put them on in the morning.  Cutting down salt will also help.  Here is a list of primary care providers who are taking new patients:  Dr. Elizabeth Sauer [address] [place] [address] [phone]  [facility] at [facility] [address] [place], [place] [address] [phone]  [facility] [place] [address] [place] [address]  [phone]  [facility] [address] [place], [place] [address] [phone]  [facility] [address] [place]  [phone] [place], [place] [address]  Here are clinics/ other resources who will see you if you do not have insurance. Some have certain criteria that you must meet. Call them and find out what they are:  [facility]: [address] [place], [place] [address] Phone: [phone] Hours: First and Third Saturdays of each Month, 9 a.m. - 1 p.m.  [facility]: [address], [address] [place], [place] [address] Phone:  [phone] Hours: Tuesday, 4 p.m. - 8 p.m. Thursday, 1 p.m. - 8 p.m. Wednesday, 9 a.m. - [facility] [address] [place], [place] [address] Phone: [phone] Pharmacy Phone Number: [phone] Dental Phone Number: [phone] [facility] Insurance Help: [phone]  Dental Hours: Monday - Thursday, 8 a.m. - 6 p.m.  [facility] [address], [place], [place] [address] Phone: [phone] Pharmacy Phone Number: [phone] [facility] Insurance Help: [phone]  [facility] [address] [place] [place], [place] [address] Phone: [phone] Pharmacy Phone Number: [phone] [facility] Insurance Help: [phone]  [facility] [address] [place], [place] [address] Phone: [phone] [facility] Insurance Help: [phone]   [facility] [address], [place], [place] [address] Phone: [phone]  Go to www.goodrx.com to look up your medications. This will give you a list of where you can find your prescriptions at the most affordable prices. Or ask the pharmacist what the cash price is, or if they have any other discount programs available to help make your medication more affordable. This can be less expensive than what you would pay with insurance.

## 2020-06-29 ENCOUNTER — Ambulatory Visit (INDEPENDENT_AMBULATORY_CARE_PROVIDER_SITE_OTHER): Payer: 59

## 2020-06-29 ENCOUNTER — Other Ambulatory Visit: Payer: Self-pay

## 2020-06-29 ENCOUNTER — Ambulatory Visit
Admission: EM | Admit: 2020-06-29 | Discharge: 2020-06-29 | Disposition: A | Discharge status: Home / Self Care | Payer: 59 | Attending: Physician Assistant | Admitting: Physician Assistant

## 2020-06-29 DIAGNOSIS — J309 Allergic rhinitis, unspecified: Secondary | ICD-10-CM | POA: Diagnosis present

## 2020-06-29 DIAGNOSIS — R0602 Shortness of breath: Secondary | ICD-10-CM | POA: Insufficient documentation

## 2020-06-29 DIAGNOSIS — R062 Wheezing: Secondary | ICD-10-CM

## 2020-06-29 DIAGNOSIS — Z20822 Contact with and (suspected) exposure to covid-19: Secondary | ICD-10-CM | POA: Diagnosis not present

## 2020-06-29 DIAGNOSIS — I10 Essential (primary) hypertension: Secondary | ICD-10-CM | POA: Insufficient documentation

## 2020-06-29 DIAGNOSIS — R059 Cough, unspecified: Secondary | ICD-10-CM | POA: Diagnosis not present

## 2020-06-29 DIAGNOSIS — R0981 Nasal congestion: Secondary | ICD-10-CM | POA: Diagnosis not present

## 2020-06-29 LAB — SARS CORONAVIRUS 2 (TAT 6-24 HRS): SARS Coronavirus 2: NEGATIVE

## 2020-06-29 MED ORDER — PSEUDOEPH-BROMPHEN-DM 30-2-10 MG/5ML PO SYRP: 5.0000 mL | ORAL_SOLUTION | Freq: Four times a day (QID) | ORAL | 1 refills | Status: AC | PRN

## 2020-06-29 MED ORDER — ALBUTEROL SULFATE HFA 108 (90 BASE) MCG/ACT IN AERS: 1.0000 | INHALATION_SPRAY | Freq: Four times a day (QID) | RESPIRATORY_TRACT | 0 refills | Status: DC | PRN

## 2020-06-29 MED ORDER — IPRATROPIUM BROMIDE 0.06 % NA SOLN: 2.0000 | Freq: Four times a day (QID) | NASAL | 12 refills | Status: DC

## 2020-06-29 NOTE — ED Provider Notes (Signed)
[facility]    CSN: [id number] Arrival date & time: 06/29/20  0931      History   Chief Complaint Chief Complaint  Patient presents with  . Nasal Congestion  . Cough    HPI Tristan Thompson is a 54 y.o. male presenting for cough, runny nose, nasal congestion, and mild shortness of breath over the past week.  He says that his cough is productive of clearish sputum.  Patient denies any associated fever or chest pain.  Patient denies sore throat but admits that it is scratchy.  He denies any ear or sinus pain.  Denies body aches, fatigue or loss of smell and taste.  Patient denies any known Covid exposure and is fully vaccinated for Covid.  Patient says he is allergic to cats and his wife does have a cat that is in and out of the house.  He is not taking any over-the-counter medications and does not take anything for allergies.  Additionally, patient notes that his blood pressure is elevated.  He was seen in August 2021 and Dr. Terrilee CroakMortensen filled amlodipine 5 mg tablets for 90 days.  She advised him to follow-up with the PCP, but he says he lost his list and never made an appointment with her primary care provider he wants to have his blood pressure medication changed today because he does not think the one he is taking is strong enough.  Patient does admit to some intermittent swelling of the ankles and feet.  He also has a history of varicose veins and venous disease.  Admits to some rashes that come and go of the lower legs as well.  He says he is used a cream on it that helped in the past.  He has no other complaints or concerns today.  HPI  Past Medical History:  Diagnosis Date  . Anxiety   . Chronic back pain   . Hypertension    has a prescription but has not had it filled.  Marland Kitchen. Umbilical hernia    has had surgery to "patch" it    Patient Active Problem List   Diagnosis Date Noted  . Alcohol dependence (HCC) 01/21/2017  . Anxiety 01/21/2017  . Chronic back pain  01/21/2017  . Hypertension 01/21/2017  . Seasonal allergies 01/21/2017    Past Surgical History:  Procedure Laterality Date  . ANTERIOR CERVICAL DECOMP/DISCECTOMY FUSION N/A 07/16/2013   Procedure: ANTERIOR CERVICAL DECOMPRESSION/DISCECTOMY FUSION CERVICAL THREE-FOUR,FOUR-FIVE AND CERVICAL SIX-SEVEN;  Surgeon: Reinaldo Meekerandy O Kritzer, MD;  Location: [facility];  Service: Neurosurgery;  Laterality: N/A;  . BACK SURGERY    . ESOPHAGOGASTRODUODENOSCOPY N/A 01/04/2017   Procedure: ESOPHAGOGASTRODUODENOSCOPY (EGD);  Surgeon: Toney Reilohini Reddy Vanga, MD;  [facility];  Service: Gastroenterology;  Laterality: N/A;  . HERNIA REPAIR         Home Medications    Prior to Admission medications   Medication Sig Start Date End Date Taking? Authorizing Provider  amLODipine (NORVASC) 5 MG tablet Take 1 tablet (5 mg total) by mouth daily. 04/25/20  Yes Domenick GongMortenson, Ashley, MD  albuterol (VENTOLIN HFA) 108 (90 Base) MCG/ACT inhaler Inhale 1-2 puffs into the lungs every 6 (six) hours as needed for wheezing or shortness of breath. 06/29/20 07/29/20  Eusebio FriendlyEaves, Denham Mose B, PA-C  brompheniramine-pseudoephedrine-DM 30-2-10 MG/5ML syrup Take 5 mLs by mouth 4 (four) times daily as needed for up to 10 days. 06/29/20 07/09/20  Eusebio FriendlyEaves, Kaegan Hettich B, PA-C  ipratropium (ATROVENT) 0.06 % nasal spray Place 2 sprays into both nostrils  4 (four) times daily. 06/29/20   Shirlee Latch, PA-C    Family History Family History  Problem Relation Age of Onset  . Arthritis Mother   . Hypertension Father     Social History Social History   Tobacco Use  . Smoking status: Current Every Day Smoker    Packs/day: 0.50    Types: Cigarettes  . Smokeless tobacco: Never Used  Vaping Use  . Vaping Use: Never used  Substance Use Topics  . Alcohol use: Yes    Alcohol/week: 4.0 standard drinks    Types: 4 Cans of beer per week    Comment: six pack of beer/day  . Drug use: No     Allergies   Patient has no known  allergies.   Review of Systems Review of Systems  Constitutional: Negative for chills, fatigue and fever.  HENT: Positive for congestion and rhinorrhea. Negative for sinus pressure, sinus pain and sore throat.   Respiratory: Positive for cough, shortness of breath and wheezing (Patient says he wheezes at night).   Cardiovascular: Negative for chest pain and palpitations.  Gastrointestinal: Positive for diarrhea and nausea. Negative for abdominal pain and vomiting.  Musculoskeletal: Positive for myalgias.  Skin: Negative for rash.  Neurological: Negative for weakness and headaches.     Physical Exam Triage Vital Signs ED Triage Vitals  Enc Vitals Group     BP 06/29/20 0945 (!) 155/79     Pulse Rate 06/29/20 0945 66     Resp 06/29/20 0945 20     Temp 06/29/20 0945 98.1 F (36.7 C)     Temp Source 06/29/20 0945 Oral     SpO2 06/29/20 0945 100 %     Weight --      Height --      Head Circumference --      Peak Flow --      Pain Score 06/29/20 0946 7     Pain Loc --      Pain Edu? --      Excl. in GC? --    No data found.  Updated Vital Signs BP (!) 155/79 (BP Location: Left Arm)   Pulse 66   Temp 98.1 F (36.7 C) (Oral)   Resp 20   SpO2 100%       Physical Exam Vitals and nursing note reviewed.  Constitutional:      General: He is not in acute distress.    Appearance: Normal appearance. He is well-developed. He is not toxic-appearing.  HENT:     Head: Normocephalic and atraumatic.     Right Ear: Tympanic membrane, ear canal and external ear normal.     Left Ear: Tympanic membrane, ear canal and external ear normal.     Nose: Congestion and rhinorrhea present.     Mouth/Throat:     Mouth: Mucous membranes are moist.     Pharynx: Posterior oropharyngeal erythema (with large amount of PND) present.  Eyes:     General: No scleral icterus.    Conjunctiva/sclera: Conjunctivae normal.  Cardiovascular:     Rate and Rhythm: Normal rate and regular rhythm.      Heart sounds: Normal heart sounds. No murmur heard.   Pulmonary:     Effort: Pulmonary effort is normal. No respiratory distress.     Breath sounds: Normal breath sounds. No wheezing, rhonchi or rales.  Abdominal:     Palpations: Abdomen is soft.     Tenderness: There is no abdominal tenderness.  Musculoskeletal:  Cervical back: Neck supple.     Right lower leg: Edema present.     Left lower leg: Edema present.     Comments: Mild edema of dorsal feet to ankles that is pitting.  There is also stasis dermatitis present no calf tenderness.  Skin:    General: Skin is warm and dry.  Neurological:     General: No focal deficit present.     Mental Status: He is alert. Mental status is at baseline.     Motor: No weakness.     Gait: Gait normal.  Psychiatric:        Mood and Affect: Mood normal.        Behavior: Behavior normal.        Thought Content: Thought content normal.      UC Treatments / Results  Labs (all labs ordered are listed, but only abnormal results are displayed) Labs Reviewed  SARS CORONAVIRUS 2 (TAT 6-24 HRS)    EKG   Radiology DG Chest 2 View  Result Date: 06/29/2020 CLINICAL DATA:  Cough and wheezing EXAM: CHEST - 2 VIEW COMPARISON:  January 04, 2017. FINDINGS: Stable apparent nipple shadow on the right. No edema or airspace opacity. Heart size and pulmonary vascularity are normal. No adenopathy. There is degenerative change in the midthoracic region. There is postoperative change in the lower cervical region. IMPRESSION: No edema or airspace opacity. Stable apparent nipple shadow on the right. Cardiac silhouette within normal limits. Electronically Signed   By: Bretta Bang III M.D.   On: 06/29/2020 10:32    Procedures Procedures (including critical care time)  Medications Ordered in UC Medications - No data to display  Initial Impression / Assessment and Plan / UC Course  I have reviewed the triage vital signs and the nursing notes.  Pertinent  labs & imaging results that were available during my care of the patient were reviewed by me and considered in my medical decision making (see chart for details).   Imaging of the chest within normal limits.  I also reviewed the images myself.  All vital signs stable.  Chest is clear to auscultation.  Blood pressure initially mildly elevated and is within normal limits toward the end of the visit.  I suspect patient may be having allergy type symptoms to his cat.  He says the cats been staying inside more often and is normally outside.  I sent Bromfed, Atrovent nasal spray, and albuterol for him to use.  Advised to try to stay away from the cat and keep in a separate area of the house.  Additionally, I discussed with patient that he could be having viral type symptoms as well.  Covid testing obtained.  CDC guidelines, isolation protocol and ED precautions discussed with patient if positive.  Advised patient that he really needs to follow-up with his PCP for management of his blood pressure.  He was provided with a list previously for PCPs.  I advised him to contact someone from med center [place] and provided a contact number.  Advised him to keep a log of blood pressure.  I suspect that the peripheral edema is likely due to venous disease and advised that he may need follow-up of that as well.  Explained that amlodipine can cause some increased fluid and that may need to be changed, but I do not feel comfortable doing that since he does not for PCP and he really needs to establish.  Patient understanding and agreeable.   Final Clinical Impressions(s) /  UC Diagnoses   Final diagnoses:  Allergic rhinitis, unspecified seasonality, unspecified trigger  Cough  Nasal congestion  Shortness of breath  Essential hypertension     Discharge Instructions     COUGH/CONGESTION/ SHORTNESS OF BREATH: Imaging of your chest was within normal notes today.  There is no pneumonia present.  No fluid in your lungs.   This could be all allergy related, possibly due to your cat.  Begin the cough medication which will treat your cough as well as the postnasal drainage due to possible allergies.  There is also a possibility that this is a viral infection in which case it may take another week or so to run its course but the same medication should help.  There is also a nasal spray have sent for you.  I have also sent a albuterol inhaler for use as needed for shortness of breath or wheezing.  Increase your fluid intake and rest.  Covid testing has been obtained today and we will call if you are positive.  HYPERTENSION: BP elevated in clinic today. Advised to keep BP log and f/u with PCP about possible medication adjustments. Go to ER if severe hypertension with or without symptoms of headache, blurred vision, dizziness, chest pain, focal weakness, confusion, urinary problems, extremity swelling, etc.   Please contact the primary care office upstairs at med center [facility] for appointment.  You need to establish with a primary care provider to help manage your blood pressure.  Continue medication that you are prescribed at this time.  [facility] at [facility], [address], [address]. Phone number: [phone] Please call this number to establish with primary care   Additionally you do have rash of your lower legs likely due to venous disease.  You can apply topical hydrocortisone cream.  Use compression stockings and try to elevate your feet as often as possible.  You have received COVID testing today either for positive exposure, concerning symptoms that could be related to COVID infection, screening purposes, or re-testing after confirmed positive.  Your test obtained today checks for active viral infection in the last 1-2 weeks. If your test is negative now, you can still test positive later. So, if you do develop symptoms you should either get re-tested and/or isolate x 10 days. Please follow CDC  guidelines.  While Rapid antigen tests come back in 15-20 minutes, send out PCR/molecular test results typically come back within 24 hours. In the mean time, if you are symptomatic, assume this could be a positive test and treat/monitor yourself as if you do have COVID.   We will call with test results. Please download the MyChart app and set up a profile to access test results.   If symptomatic, go home and rest. Push fluids. Take Tylenol as needed for discomfort. Gargle warm salt water. Throat lozenges. Take Mucinex DM or Robitussin for cough. Humidifier in bedroom to ease coughing. Warm showers. Also review the COVID handout for more information.  COVID-19 INFECTION: The incubation period of COVID-19 is approximately 14 days after exposure, with most symptoms developing in roughly 4-5 days. Symptoms may range in severity from mild to critically severe. Roughly 80% of those infected will have mild symptoms. People of any age may become infected with COVID-19 and have the ability to transmit the virus. The most common symptoms include: fever, fatigue, cough, body aches, headaches, sore throat, nasal congestion, shortness of breath, nausea, vomiting, diarrhea, changes in smell and/or taste.    COURSE OF ILLNESS  Some patients may begin with mild disease which can progress quickly into critical symptoms. If your symptoms are worsening please call ahead to the Emergency Department and proceed there for further treatment. Recovery time appears to be roughly 1-2 weeks for mild symptoms and 3-6 weeks for severe disease.   GO IMMEDIATELY TO ER FOR FEVER YOU ARE UNABLE TO GET DOWN WITH TYLENOL, BREATHING PROBLEMS, CHEST PAIN, FATIGUE, LETHARGY, INABILITY TO EAT OR DRINK, ETC  QUARANTINE AND ISOLATION: To help decrease the spread of COVID-19 please remain isolated if you have COVID infection or are highly suspected to have COVID infection. This means -stay home and isolate to one room in the home if you live  with others. Do not share a bed or bathroom with others while ill, sanitize and wipe down all countertops and keep common areas clean and disinfected. You may discontinue isolation if you have a mild case and are asymptomatic 10 days after symptom onset as long as you have been fever free >24 hours without having to take Motrin or Tylenol. If your case is more severe (meaning you develop pneumonia or are admitted in the hospital), you may have to isolate longer.   If you have been in close contact (within 6 feet) of someone diagnosed with COVID 19, you are advised to quarantine in your home for 14 days as symptoms can develop anywhere from 2-14 days after exposure to the virus. If you develop symptoms, you  must isolate.  Most current guidelines for COVID after exposure -isolate 10 days if you ARE NOT tested for COVID as long as symptoms do not develop -isolate 7 days if you are tested and remain asymptomatic -You do not necessarily need to be tested for COVID if you have + exposure and        develop   symptoms. Just isolate at home x10 days from symptom onset During this global pandemic, CDC advises to practice social distancing, try to stay at least 22ft away from others at all times. Wear a face covering. Wash and sanitize your hands regularly and avoid going anywhere that is not necessary.  KEEP IN MIND THAT THE COVID TEST IS NOT 100% ACCURATE AND YOU SHOULD STILL DO EVERYTHING TO PREVENT POTENTIAL SPREAD OF VIRUS TO OTHERS (WEAR MASK, WEAR GLOVES, WASH HANDS AND SANITIZE REGULARLY). IF INITIAL TEST IS NEGATIVE, THIS MAY NOT MEAN YOU ARE DEFINITELY NEGATIVE. MOST ACCURATE TESTING IS DONE 5-7 DAYS AFTER EXPOSURE.   It is not advised by CDC to get re-tested after receiving a positive COVID test since you can still test positive for weeks to months after you have already cleared the virus.   *If you have not been vaccinated for COVID, I strongly suggest you consider getting vaccinated as long as there  are no contraindications.      ED Prescriptions    Medication Sig Dispense Auth. Provider   brompheniramine-pseudoephedrine-DM 30-2-10 MG/5ML syrup Take 5 mLs by mouth 4 (four) times daily as needed for up to 10 days. 120 mL Eusebio Friendly B, PA-C   ipratropium (ATROVENT) 0.06 % nasal spray Place 2 sprays into both nostrils 4 (four) times daily. 15 mL Eusebio Friendly B, PA-C   albuterol (VENTOLIN HFA) 108 (90 Base) MCG/ACT inhaler Inhale 1-2 puffs into the lungs every 6 (six) hours as needed for wheezing or shortness of breath. 1 g Shirlee Latch, PA-C     PDMP not reviewed this encounter.   Shirlee Latch, PA-C 06/29/20 1133

## 2020-06-29 NOTE — Discharge Instructions (Signed)
COUGH/CONGESTION/ SHORTNESS OF BREATH: Imaging of your chest was within normal notes today.  There is no pneumonia present.  No fluid in your lungs.  This could be all allergy related, possibly due to your cat.  Begin the cough medication which will treat your cough as well as the postnasal drainage due to possible allergies.  There is also a possibility that this is a viral infection in which case it may take another week or so to run its course but the same medication should help.  There is also a nasal spray have sent for you.  I have also sent a albuterol inhaler for use as needed for shortness of breath or wheezing.  Increase your fluid intake and rest.  Covid testing has been obtained today and we will call if you are positive.  HYPERTENSION: BP elevated in clinic today. Advised to keep BP log and f/u with PCP about possible medication adjustments. Go to ER if severe hypertension with or without symptoms of headache, blurred vision, dizziness, chest pain, focal weakness, confusion, urinary problems, extremity swelling, etc.   Please contact the primary care office upstairs at med center [facility] for appointment.  You need to establish with a primary care provider to help manage your blood pressure.  Continue medication that you are prescribed at this time.  [facility] at [facility], [address], [address]. Phone number: [phone] Please call this number to establish with primary care   Additionally you do have rash of your lower legs likely due to venous disease.  You can apply topical hydrocortisone cream.  Use compression stockings and try to elevate your feet as often as possible.  You have received COVID testing today either for positive exposure, concerning symptoms that could be related to COVID infection, screening purposes, or re-testing after confirmed positive.  Your test obtained today checks for active viral infection in the last 1-2 weeks. If your test is  negative now, you can still test positive later. So, if you do develop symptoms you should either get re-tested and/or isolate x 10 days. Please follow CDC guidelines.  While Rapid antigen tests come back in 15-20 minutes, send out PCR/molecular test results typically come back within 24 hours. In the mean time, if you are symptomatic, assume this could be a positive test and treat/monitor yourself as if you do have COVID.   We will call with test results. Please download the MyChart app and set up a profile to access test results.   If symptomatic, go home and rest. Push fluids. Take Tylenol as needed for discomfort. Gargle warm salt water. Throat lozenges. Take Mucinex DM or Robitussin for cough. Humidifier in bedroom to ease coughing. Warm showers. Also review the COVID handout for more information.  COVID-19 INFECTION: The incubation period of COVID-19 is approximately 14 days after exposure, with most symptoms developing in roughly 4-5 days. Symptoms may range in severity from mild to critically severe. Roughly 80% of those infected will have mild symptoms. People of any age may become infected with COVID-19 and have the ability to transmit the virus. The most common symptoms include: fever, fatigue, cough, body aches, headaches, sore throat, nasal congestion, shortness of breath, nausea, vomiting, diarrhea, changes in smell and/or taste.    COURSE OF ILLNESS Some patients may begin with mild disease which can progress quickly into critical symptoms. If your symptoms are worsening please call ahead to the Emergency Department and proceed there for further treatment. Recovery time appears to  be roughly 1-2 weeks for mild symptoms and 3-6 weeks for severe disease.   GO IMMEDIATELY TO ER FOR FEVER YOU ARE UNABLE TO GET DOWN WITH TYLENOL, BREATHING PROBLEMS, CHEST PAIN, FATIGUE, LETHARGY, INABILITY TO EAT OR DRINK, ETC  QUARANTINE AND ISOLATION: To help decrease the spread of COVID-19 please remain  isolated if you have COVID infection or are highly suspected to have COVID infection. This means -stay home and isolate to one room in the home if you live with others. Do not share a bed or bathroom with others while ill, sanitize and wipe down all countertops and keep common areas clean and disinfected. You may discontinue isolation if you have a mild case and are asymptomatic 10 days after symptom onset as long as you have been fever free >24 hours without having to take Motrin or Tylenol. If your case is more severe (meaning you develop pneumonia or are admitted in the hospital), you may have to isolate longer.   If you have been in close contact (within 6 feet) of someone diagnosed with COVID 19, you are advised to quarantine in your home for 14 days as symptoms can develop anywhere from 2-14 days after exposure to the virus. If you develop symptoms, you  must isolate.  Most current guidelines for COVID after exposure -isolate 10 days if you ARE NOT tested for COVID as long as symptoms do not develop -isolate 7 days if you are tested and remain asymptomatic -You do not necessarily need to be tested for COVID if you have + exposure and        develop   symptoms. Just isolate at home x10 days from symptom onset During this global pandemic, CDC advises to practice social distancing, try to stay at least 25ft away from others at all times. Wear a face covering. Wash and sanitize your hands regularly and avoid going anywhere that is not necessary.  KEEP IN MIND THAT THE COVID TEST IS NOT 100% ACCURATE AND YOU SHOULD STILL DO EVERYTHING TO PREVENT POTENTIAL SPREAD OF VIRUS TO OTHERS (WEAR MASK, WEAR GLOVES, WASH HANDS AND SANITIZE REGULARLY). IF INITIAL TEST IS NEGATIVE, THIS MAY NOT MEAN YOU ARE DEFINITELY NEGATIVE. MOST ACCURATE TESTING IS DONE 5-7 DAYS AFTER EXPOSURE.   It is not advised by CDC to get re-tested after receiving a positive COVID test since you can still test positive for weeks to months  after you have already cleared the virus.   *If you have not been vaccinated for COVID, I strongly suggest you consider getting vaccinated as long as there are no contraindications.

## 2020-06-29 NOTE — ED Triage Notes (Signed)
Pt c/o productive cough with clear sputum, runny nose, congestion, diarrhea, mild SOB, nausea,  for approx 1 week.   Denies fever, sore throat, ear pain, body aches, loss of taste/smell.  Bilateral wheezes with decreased exchange noted. No OTC meds for symptoms taken. Has had both COVID vaccines approx 5 months ago.

## 2021-01-16 ENCOUNTER — Other Ambulatory Visit: Payer: Self-pay | Admitting: Physical Medicine & Rehabilitation

## 2021-01-16 DIAGNOSIS — M5441 Lumbago with sciatica, right side: Secondary | ICD-10-CM

## 2021-01-16 DIAGNOSIS — G8929 Other chronic pain: Secondary | ICD-10-CM

## 2021-01-23 ENCOUNTER — Ambulatory Visit: Payer: 59

## 2021-01-30 ENCOUNTER — Other Ambulatory Visit: Payer: Self-pay

## 2021-01-30 ENCOUNTER — Ambulatory Visit
Admission: RE | Admit: 2021-01-30 | Discharge: 2021-01-30 | Disposition: A | Discharge status: Home / Self Care | Payer: 59 | Source: Ambulatory Visit | Attending: Physical Medicine & Rehabilitation | Admitting: Physical Medicine & Rehabilitation

## 2021-01-30 DIAGNOSIS — M5441 Lumbago with sciatica, right side: Secondary | ICD-10-CM | POA: Diagnosis present

## 2021-01-30 DIAGNOSIS — G8929 Other chronic pain: Secondary | ICD-10-CM | POA: Diagnosis present

## 2021-03-20 ENCOUNTER — Encounter: Payer: Self-pay | Admitting: Emergency Medicine

## 2021-03-20 ENCOUNTER — Ambulatory Visit
Admission: EM | Admit: 2021-03-20 | Discharge: 2021-03-20 | Disposition: A | Discharge status: Home / Self Care | Payer: 59 | Attending: Emergency Medicine | Admitting: Emergency Medicine

## 2021-03-20 ENCOUNTER — Other Ambulatory Visit: Payer: Self-pay

## 2021-03-20 DIAGNOSIS — F17210 Nicotine dependence, cigarettes, uncomplicated: Secondary | ICD-10-CM | POA: Diagnosis not present

## 2021-03-20 DIAGNOSIS — R197 Diarrhea, unspecified: Secondary | ICD-10-CM | POA: Diagnosis not present

## 2021-03-20 DIAGNOSIS — Z79899 Other long term (current) drug therapy: Secondary | ICD-10-CM | POA: Insufficient documentation

## 2021-03-20 DIAGNOSIS — Z20822 Contact with and (suspected) exposure to covid-19: Secondary | ICD-10-CM | POA: Diagnosis not present

## 2021-03-20 MED ORDER — FAMOTIDINE 20 MG PO TABS: 20.0000 mg | ORAL_TABLET | Freq: Two times a day (BID) | ORAL | 0 refills | Status: DC

## 2021-03-20 MED ORDER — ONDANSETRON 4 MG PO TBDP: 4.0000 mg | ORAL_TABLET | Freq: Three times a day (TID) | ORAL | 0 refills | Status: DC | PRN

## 2021-03-20 NOTE — ED Triage Notes (Signed)
Reports lower abdominal pain and diarrhea for a "few days". Notes he had to leave work today due to diarrhea.    Diarrhea x2 episodes in 24 hours, notes a normal BM yesterday.

## 2021-03-20 NOTE — Discharge Instructions (Addendum)
Push electrolyte containing fluids such as Pedialyte or Gatorade to replace the electrolytes that you lost with the diarrhea.  You can try Zofran, and if that does not work, then Imodium.  I would also start some probiotics.  You can try some Pepcid for your upper abdominal pain.  COVID test will be back in 24 hours.  Go immediately to the ER for fevers above 100.4, bloody diarrhea, no urination in 12 hours, if abdominal pain changes or gets worse, or for any other concerns

## 2021-03-20 NOTE — ED Provider Notes (Signed)
HPI  SUBJECTIVE:  Tristan Thompson is a 55 y.o. male who presents with 4 days of 1-2 episodes of watery, nonbloody diarrhea per day.  States that he was getting better yesterday, but got worse today.  He admits to drinking 12 beers last night.  He also reports constant, nonradiating, nonmigratory burning upper abdominal pain starting this morning with no aggravating or alleviating factors.  No recent travel, raw or undercooked foods, questionable leftovers, contacts with diarrhea, exposure to chickens or reptiles, recent antibiotics.  No fevers, abdominal distention, nausea, vomiting, anorexia, change in urine output.  No recent change in his diet although he eats primarily fast food.  No body aches, headaches, nasal congestion, sore throat, loss of sense of smell or taste, cough, shortness of breath.  No antipyretic in the past 6 hours.  No known COVID exposure.  He had a second dose of the COVID-vaccine.  He tried eating without improvement in symptoms.  No aggravating factors.  He has a past medical history of hypertension, status post umbilical hernia repair.  No pain or tenderness around the surgical site.  May have alcohol dependence, states that he drinks 4-5 beers daily.  No history of IBS, ulcerative colitis, pancreatitis, liver disease, gallbladder disease, diabetes, hypertension.  JHE:[id number], Trisha Mangle, MD -has an appointment on Friday.   Past Medical History:  Diagnosis Date   Anxiety    Chronic back pain    Hypertension    has a prescription but has not had it filled.   Umbilical hernia    has had surgery to "patch" it    Past Surgical History:  Procedure Laterality Date   ANTERIOR CERVICAL DECOMP/DISCECTOMY FUSION N/A 07/16/2013   Procedure: ANTERIOR CERVICAL DECOMPRESSION/DISCECTOMY FUSION CERVICAL THREE-FOUR,FOUR-FIVE AND CERVICAL SIX-SEVEN;  Surgeon: Reinaldo Meeker, MD;  Location: [facility];  Service: Neurosurgery;  Laterality: N/A;   BACK SURGERY      ESOPHAGOGASTRODUODENOSCOPY N/A 01/04/2017   Procedure: ESOPHAGOGASTRODUODENOSCOPY (EGD);  Surgeon: Toney Reil, MD;  Location: [facility] ENDOSCOPY;  Service: Gastroenterology;  Laterality: N/A;   HERNIA REPAIR      Family History  Problem Relation Age of Onset   Arthritis Mother    Hypertension Father     Social History   Tobacco Use   Smoking status: Every Day    Packs/day: 0.50    Pack years: 0.00    Types: Cigarettes   Smokeless tobacco: Never  Vaping Use   Vaping Use: Never used  Substance Use Topics   Alcohol use: Yes    Alcohol/week: 4.0 standard drinks    Types: 4 Cans of beer per week    Comment: six pack of beer/day   Drug use: No    No current facility-administered medications for this encounter.  Current Outpatient Medications:    escitalopram (LEXAPRO) 5 MG tablet, Take 5 mg by mouth daily., Disp: , Rfl:    famotidine (PEPCID) 20 MG tablet, Take 1 tablet (20 mg total) by mouth 2 (two) times daily., Disp: 30 tablet, Rfl: 0   hydrochlorothiazide (MICROZIDE) 12.5 MG capsule, Take 12.5 mg by mouth daily., Disp: , Rfl:    ondansetron (ZOFRAN ODT) 4 MG disintegrating tablet, Take 1 tablet (4 mg total) by mouth every 8 (eight) hours as needed for nausea or vomiting., Disp: 20 tablet, Rfl: 0   albuterol (VENTOLIN HFA) 108 (90 Base) MCG/ACT inhaler, Inhale 1-2 puffs into the lungs every 6 (six) hours as needed for wheezing or shortness of breath., Disp: 1 g, Rfl: 0  amLODipine (NORVASC) 5 MG tablet, Take 1 tablet (5 mg total) by mouth daily., Disp: 90 tablet, Rfl: 0   ipratropium (ATROVENT) 0.06 % nasal spray, Place 2 sprays into both nostrils 4 (four) times daily., Disp: 15 mL, Rfl: 12  No Known Allergies   ROS  As noted in HPI.   Physical Exam  BP (!) 150/85   Pulse 74   Temp 98.5 F (36.9 C) (Oral)   Resp 16   SpO2 98%   Constitutional: Well developed, well nourished, no acute distress Eyes:  EOMI, conjunctiva normal bilaterally HENT: Normocephalic,  atraumatic,mucus membranes moist Respiratory: Normal inspiratory effort Cardiovascular: Normal rate.  Cap refill less than 2 seconds. GI: Positive infraumbilical surgical scar.  No tenderness in this region.  Soft, nontender over entire abdomen, nondistended, active bowel sounds.  No rebound, guarding.  Negative Murphy, negative McBurney. Back: No CVAT skin: No rash, skin intact Musculoskeletal: no deformities Neurologic: Alert & oriented x 3, no focal neuro deficits Psychiatric: Speech and behavior appropriate   ED Course   Medications - No data to display  Orders Placed This Encounter  Procedures   SARS CORONAVIRUS 2 (TAT 6-24 HRS) Nasopharyngeal Nasopharyngeal Swab    Standing Status:   Standing    Number of Occurrences:   1    Order Specific Question:   Is this test for diagnosis or screening    Answer:   Diagnosis of ill patient    Order Specific Question:   Symptomatic for COVID-19 as defined by CDC    Answer:   Yes    Order Specific Question:   Date of Symptom Onset    Answer:   03/16/2021    Order Specific Question:   Hospitalized for COVID-19    Answer:   No    Order Specific Question:   Admitted to ICU for COVID-19    Answer:   No    Order Specific Question:   Previously tested for COVID-19    Answer:   No    Order Specific Question:   Resident in a congregate (group) care setting    Answer:   No    Order Specific Question:   Employed in healthcare setting    Answer:   No    Order Specific Question:   Has patient completed COVID vaccination(s) (2 doses of Pfizer/Moderna 1 dose of [employer])    Answer:   Yes    Order Specific Question:   Has patient completed COVID Booster / 3rd dose    Answer:   No    No results found for this or any previous visit (from the past 24 hour(s)). No results found.  ED Clinical Impression  1. Diarrhea, unspecified type      ED Assessment/Plan  No indications for labs, stool studies, or antibiotics at this time.  Will  have him push fluids, try Zofran, and if that does not work, then Imodium.  Advised him to start probiotics.  We will also send off COVID.  The burning upper abdominal pain I suspect is a gastritis from the excess alcohol that he had yesterday.  His abdomen is otherwise benign.  There is no evidence of perforation, obstruction, cholelithiasis, hepatitis, pancreatitis.  Patient has follow-up with his PMD on Friday.  May return here as needed.  ER return precautions given.  2-day work note.  Discussed labs, imaging, MDM, treatment plan, and plan for follow-up with patient. Discussed sn/sx that should prompt return to the ED. patient agrees  with plan.   Meds ordered this encounter  Medications   ondansetron (ZOFRAN ODT) 4 MG disintegrating tablet    Sig: Take 1 tablet (4 mg total) by mouth every 8 (eight) hours as needed for nausea or vomiting.    Dispense:  20 tablet    Refill:  0   famotidine (PEPCID) 20 MG tablet    Sig: Take 1 tablet (20 mg total) by mouth 2 (two) times daily.    Dispense:  30 tablet    Refill:  0      *This clinic note was created using Scientist, clinical (histocompatibility and immunogenetics). Therefore, there may be occasional mistakes despite careful proofreading.  ?    Domenick Gong, MD 03/20/21 1314

## 2021-03-21 LAB — SARS CORONAVIRUS 2 (TAT 6-24 HRS): SARS Coronavirus 2: NEGATIVE

## 2021-03-28 ENCOUNTER — Encounter: Payer: Self-pay | Admitting: Emergency Medicine

## 2021-03-28 ENCOUNTER — Other Ambulatory Visit: Payer: Self-pay

## 2021-03-28 ENCOUNTER — Ambulatory Visit
Admission: EM | Admit: 2021-03-28 | Discharge: 2021-03-28 | Disposition: A | Discharge status: Home / Self Care | Payer: 59 | Attending: Sports Medicine | Admitting: Sports Medicine

## 2021-03-28 DIAGNOSIS — Z20822 Contact with and (suspected) exposure to covid-19: Secondary | ICD-10-CM

## 2021-03-28 LAB — SARS CORONAVIRUS 2 (TAT 6-24 HRS): SARS Coronavirus 2: NEGATIVE

## 2021-03-28 NOTE — ED Triage Notes (Signed)
Pt presents today requesting a PCR Covid test. His wife tested positive yesterday. He denies any symptoms at this time.

## 2021-03-29 ENCOUNTER — Other Ambulatory Visit: Payer: Self-pay

## 2021-03-29 ENCOUNTER — Ambulatory Visit
Admission: EM | Admit: 2021-03-29 | Discharge: 2021-03-29 | Disposition: A | Discharge status: Home / Self Care | Payer: 59 | Attending: Family Medicine | Admitting: Family Medicine

## 2021-03-29 DIAGNOSIS — U071 COVID-19: Secondary | ICD-10-CM

## 2021-03-29 LAB — RESP PANEL BY RT-PCR (FLU A&B, COVID) ARPGX2
Influenza A by PCR: NEGATIVE
Influenza B by PCR: NEGATIVE
SARS Coronavirus 2 by RT PCR: POSITIVE — AB

## 2021-03-29 MED ORDER — IBUPROFEN 600 MG PO TABS
600.0000 mg | ORAL_TABLET | Freq: Once | ORAL | Status: AC
  Administered 2021-03-29: 600 mg via ORAL

## 2021-03-29 MED ORDER — NIRMATRELVIR/RITONAVIR (PAXLOVID)TABLET: 3.0000 | ORAL_TABLET | Freq: Two times a day (BID) | ORAL | 0 refills | Status: AC

## 2021-03-29 MED ORDER — ONDANSETRON 4 MG PO TBDP: 4.0000 mg | ORAL_TABLET | Freq: Three times a day (TID) | ORAL | 0 refills | Status: DC | PRN

## 2021-03-29 NOTE — Discharge Instructions (Addendum)
Medication as prescribed. ° °If you worsen, go to the ER. ° °Take care ° °Dr. Kino Dunsworth  °

## 2021-03-29 NOTE — ED Triage Notes (Signed)
Pt reports his wife has COVID and he tested negative but now has symptoms of fever, headache, nausea. Wants another COVID test

## 2021-03-29 NOTE — ED Provider Notes (Signed)
[facility]    CSN: [id number] Arrival date & time: 03/29/21  1009      History   Chief Complaint Chief Complaint  Patient presents with   Covid Exposure   HPI 55 year male presents with COVID symptoms in the setting of exposure.  Patient reports that his symptoms started last night.  His wife is sick as well and is now COVID-positive.  He states that he developed fever, headache, nausea.  Temperature currently 102.9.  He feels very poorly.  His pain is generalized and is 4/10 in severity.  No relieving factors.  No reports of shortness of breath.  No other associated symptoms.  No other complaints.  Past Medical History:  Diagnosis Date   Anxiety    Chronic back pain    Hypertension    has a prescription but has not had it filled.   Umbilical hernia    has had surgery to "patch" it   Patient Active Problem List   Diagnosis Date Noted   Alcohol dependence (HCC) 01/21/2017   Anxiety 01/21/2017   Chronic back pain 01/21/2017   Hypertension 01/21/2017   Seasonal allergies 01/21/2017   Past Surgical History:  Procedure Laterality Date   ANTERIOR CERVICAL DECOMP/DISCECTOMY FUSION N/A 07/16/2013   Procedure: ANTERIOR CERVICAL DECOMPRESSION/DISCECTOMY FUSION CERVICAL THREE-FOUR,FOUR-FIVE AND CERVICAL SIX-SEVEN;  Surgeon: Reinaldo Meeker, MD;  Location: [facility];  Service: Neurosurgery;  Laterality: N/A;   BACK SURGERY     ESOPHAGOGASTRODUODENOSCOPY N/A 01/04/2017   Procedure: ESOPHAGOGASTRODUODENOSCOPY (EGD);  Surgeon: Toney Reil, MD;  Location: [facility] ENDOSCOPY;  Service: Gastroenterology;  Laterality: N/A;   HERNIA REPAIR     Home Medications    Prior to Admission medications   Medication Sig Start Date End Date Taking? Authorizing Provider  nirmatrelvir/ritonavir EUA (PAXLOVID) TABS Take 3 tablets by mouth 2 (two) times daily for 5 days. Patient GFR is 77. Take nirmatrelvir (150 mg) two tablets twice daily for 5 days and ritonavir (100 mg) one tablet  twice daily for 5 days. 03/29/21 04/03/21 Yes Johnney Scarlata G, DO  ondansetron (ZOFRAN ODT) 4 MG disintegrating tablet Take 1 tablet (4 mg total) by mouth every 8 (eight) hours as needed for nausea or vomiting. 03/29/21  Yes Yamil Oelke G, DO  escitalopram (LEXAPRO) 5 MG tablet Take 5 mg by mouth daily.    [provider]  famotidine (PEPCID) 20 MG tablet Take 1 tablet (20 mg total) by mouth 2 (two) times daily. 03/20/21   Domenick Gong, MD  hydrochlorothiazide (MICROZIDE) 12.5 MG capsule Take 12.5 mg by mouth daily.    [provider]    Family History Family History  Problem Relation Age of Onset   Arthritis Mother    Hypertension Father     Social History Social History   Tobacco Use   Smoking status: Every Day    Packs/day: 0.50    Types: Cigarettes   Smokeless tobacco: Never  Vaping Use   Vaping Use: Never used  Substance Use Topics   Alcohol use: Yes    Alcohol/week: 4.0 standard drinks    Types: 4 Cans of beer per week    Comment: six pack of beer/day   Drug use: No     Allergies   Patient has no known allergies.   Review of Systems Review of Systems Per HPI  Physical Exam Triage Vital Signs ED Triage Vitals  Enc Vitals Group     BP 03/29/21 1016 (!) 160/90  Pulse Rate 03/29/21 1016 80     Resp 03/29/21 1016 18     Temp 03/29/21 1016 (!) 102.9 F (39.4 C)     Temp Source 03/29/21 1016 Oral     SpO2 03/29/21 1016 97 %     Weight 03/29/21 1018 210 lb (95.3 kg)     Height 03/29/21 1018 6\' 2"  (1.88 m)     Head Circumference --      Peak Flow --      Pain Score 03/29/21 1018 4     Pain Loc --      Pain Edu? --      Excl. in GC? --    Updated Vital Signs BP (!) 160/90 (BP Location: Left Arm)   Pulse 80   Temp (!) 100.5 F (38.1 C) (Oral)   Resp 18   Ht 6\' 2"  (1.88 m)   Wt 95.3 kg   SpO2 97%   BMI 26.96 kg/m   Visual Acuity Right Eye Distance:   Left Eye Distance:   Bilateral Distance:    Right Eye Near:   Left Eye  Near:    Bilateral Near:     Physical Exam Vitals and nursing note reviewed.  Constitutional:      General: He is not in acute distress.    Appearance: Normal appearance. He is not ill-appearing.  HENT:     Head: Normocephalic and atraumatic.  Eyes:     General:        Right eye: No discharge.        Left eye: No discharge.     Conjunctiva/sclera: Conjunctivae normal.  Cardiovascular:     Rate and Rhythm: Normal rate and regular rhythm.  Pulmonary:     Effort: Pulmonary effort is normal.     Breath sounds: Normal breath sounds. No wheezing, rhonchi or rales.  Neurological:     Mental Status: He is alert.  Psychiatric:        Mood and Affect: Mood normal.        Behavior: Behavior normal.   UC Treatments / Results  Labs (all labs ordered are listed, but only abnormal results are displayed) Labs Reviewed  RESP PANEL BY RT-PCR (FLU A&B, COVID) ARPGX2 - Abnormal; Notable for the following components:      Result Value   SARS Coronavirus 2 by RT PCR POSITIVE (*)    All other components within normal limits    EKG   Radiology No results found.  Procedures Procedures (including critical care time)  Medications Ordered in UC Medications  ibuprofen (ADVIL) tablet 600 mg (600 mg Oral Given 03/29/21 1027)    Initial Impression / Assessment and Plan / UC Course  I have reviewed the triage vital signs and the nursing notes.  Pertinent labs & imaging results that were available during my care of the patient were reviewed by me and considered in my medical decision making (see chart for details).    55 year old male presents with COVID-19.  Acute illness with systemic symptoms.  Given risk factors, I am treating him with Paxlovid.  Zofran as needed for nausea.  Work note given.  Final Clinical Impressions(s) / UC Diagnoses   Final diagnoses:  COVID     Discharge Instructions      Medication as prescribed.  If you worsen, go to the ER.  Take care  Dr. 03/31/21       ED Prescriptions     Medication Sig Dispense Auth. Provider   nirmatrelvir/ritonavir  EUA (PAXLOVID) TABS Take 3 tablets by mouth 2 (two) times daily for 5 days. Patient GFR is 77. Take nirmatrelvir (150 mg) two tablets twice daily for 5 days and ritonavir (100 mg) one tablet twice daily for 5 days. 30 tablet Oliwia Berzins G, DO   ondansetron (ZOFRAN ODT) 4 MG disintegrating tablet Take 1 tablet (4 mg total) by mouth every 8 (eight) hours as needed for nausea or vomiting. 20 tablet Tommie Sams, DO      PDMP not reviewed this encounter.   Tommie Sams, [place] 03/29/21 1125

## 2021-05-10 ENCOUNTER — Other Ambulatory Visit: Payer: Self-pay

## 2021-05-10 ENCOUNTER — Ambulatory Visit
Admission: EM | Admit: 2021-05-10 | Discharge: 2021-05-10 | Disposition: A | Discharge status: Home / Self Care | Payer: 59 | Attending: Family Medicine | Admitting: Family Medicine

## 2021-05-10 DIAGNOSIS — G8929 Other chronic pain: Secondary | ICD-10-CM

## 2021-05-10 DIAGNOSIS — M5441 Lumbago with sciatica, right side: Secondary | ICD-10-CM | POA: Diagnosis not present

## 2021-05-10 MED ORDER — PREDNISONE 10 MG (21) PO TBPK: ORAL_TABLET | ORAL | 0 refills | Status: DC

## 2021-05-10 MED ORDER — BACLOFEN 10 MG PO TABS: 10.0000 mg | ORAL_TABLET | Freq: Three times a day (TID) | ORAL | 0 refills | Status: DC | PRN

## 2021-05-10 NOTE — ED Provider Notes (Signed)
[facility]    CSN: [id number] Arrival date & time: 05/10/21  0912      History   Chief Complaint Chief Complaint  Patient presents with   Back Pain   HPI 55 year old male presents with acute on chronic back pain.  Patient has known chronic back pain.  He has had an MRI earlier this year and May.  He has up until recently been going to physical therapy.  This stopped when he got diagnosed with COVID-19.  He reports a recent worsening of his low back pain with right-sided radiculopathy.  Worse over the past week.  He states that he has difficulty/pain with ambulation.  Pain is 8/10 in severity.  He reports some numbness in the right lower extremity.  No reports of saddle anesthesia or incontinence.  No other associated symptoms.  No other complaints.  Past Medical History:  Diagnosis Date   Anxiety    Chronic back pain    Hypertension    has a prescription but has not had it filled.   Umbilical hernia    has had surgery to "patch" it    Patient Active Problem List   Diagnosis Date Noted   Alcohol dependence (HCC) 01/21/2017   Anxiety 01/21/2017   Chronic back pain 01/21/2017   Hypertension 01/21/2017   Seasonal allergies 01/21/2017    Past Surgical History:  Procedure Laterality Date   ANTERIOR CERVICAL DECOMP/DISCECTOMY FUSION N/A 07/16/2013   Procedure: ANTERIOR CERVICAL DECOMPRESSION/DISCECTOMY FUSION CERVICAL THREE-FOUR,FOUR-FIVE AND CERVICAL SIX-SEVEN;  Surgeon: Reinaldo Meeker, MD;  Location: [facility];  Service: Neurosurgery;  Laterality: N/A;   BACK SURGERY     ESOPHAGOGASTRODUODENOSCOPY N/A 01/04/2017   Procedure: ESOPHAGOGASTRODUODENOSCOPY (EGD);  Surgeon: Toney Reil, MD;  Location: [facility] ENDOSCOPY;  Service: Gastroenterology;  Laterality: N/A;   HERNIA REPAIR       Home Medications    Prior to Admission medications   Medication Sig Start Date End Date Taking? Authorizing Provider  baclofen (LIORESAL) 10 MG tablet Take 1 tablet (10 mg  total) by mouth 3 (three) times daily as needed for muscle spasms. 05/10/21  Yes Carlin Mamone G, DO  escitalopram (LEXAPRO) 5 MG tablet Take 5 mg by mouth daily.   Yes [provider]  hydrochlorothiazide (MICROZIDE) 12.5 MG capsule Take 12.5 mg by mouth daily.   Yes [provider]  predniSONE (STERAPRED UNI-PAK 21 TAB) 10 MG (21) TBPK tablet 6 tablets on day 1; decrease by 1 tablet daily until gone. 05/10/21  Yes Tommie Sams, DO    Family History Family History  Problem Relation Age of Onset   Arthritis Mother    Hypertension Father     Social History Social History   Tobacco Use   Smoking status: Every Day    Packs/day: 0.50    Types: Cigarettes   Smokeless tobacco: Never  Vaping Use   Vaping Use: Never used  Substance Use Topics   Alcohol use: Yes    Alcohol/week: 4.0 standard drinks    Types: 4 Cans of beer per week    Comment: six pack of beer/day   Drug use: No     Allergies   Patient has no known allergies.   Review of Systems Review of Systems Per HPI  Physical Exam Triage Vital Signs ED Triage Vitals  Enc Vitals Group     BP 05/10/21 1042 (!) 160/99     Pulse Rate 05/10/21 1042 60     Resp 05/10/21 1042 18  Temp 05/10/21 1042 98.5 F (36.9 C)     Temp Source 05/10/21 1042 Oral     SpO2 05/10/21 1042 99 %     Weight 05/10/21 1040 212 lb (96.2 kg)     Height 05/10/21 1040 6\' 2"  (1.88 m)     Head Circumference --      Peak Flow --      Pain Score 05/10/21 1039 8     Pain Loc --      Pain Edu? --      Excl. in GC? --    Updated Vital Signs BP (!) 160/99 (BP Location: Left Arm)   Pulse 60   Temp 98.5 F (36.9 C) (Oral)   Resp 18   Ht 6\' 2"  (1.88 m)   Wt 96.2 kg   SpO2 99%   BMI 27.22 kg/m   Visual Acuity Right Eye Distance:   Left Eye Distance:   Bilateral Distance:    Right Eye Near:   Left Eye Near:    Bilateral Near:     Physical Exam Vitals and nursing note reviewed.  Constitutional:      General: He is not  in acute distress.    Appearance: Normal appearance. He is not ill-appearing.  HENT:     Head: Normocephalic and atraumatic.  Eyes:     General:        Right eye: No discharge.        Left eye: No discharge.     Conjunctiva/sclera: Conjunctivae normal.  Pulmonary:     Effort: Pulmonary effort is normal. No respiratory distress.  Musculoskeletal:     Comments: Decreased range of motion lumbar spine.  Antalgic gait.  Neurological:     Mental Status: He is alert.  Psychiatric:        Mood and Affect: Mood normal.        Behavior: Behavior normal.     UC Treatments / Results  Labs (all labs ordered are listed, but only abnormal results are displayed) Labs Reviewed - No data to display  EKG   Radiology No results found.  Procedures Procedures (including critical care time)  Medications Ordered in UC Medications - No data to display  Initial Impression / Assessment and Plan / UC Course  I have reviewed the triage vital signs and the nursing notes.  Pertinent labs & imaging results that were available during my care of the patient were reviewed by me and considered in my medical decision making (see chart for details).    55 year old male presents with acute exacerbation of chronic low back pain.  Treating with prednisone and baclofen.  Final Clinical Impressions(s) / UC Diagnoses   Final diagnoses:  Chronic right-sided low back pain with right-sided sciatica   Discharge Instructions   None    ED Prescriptions     Medication Sig Dispense Auth. Provider   predniSONE (STERAPRED UNI-PAK 21 TAB) 10 MG (21) TBPK tablet 6 tablets on day 1; decrease by 1 tablet daily until gone. 21 tablet Tannor Pyon G, DO   baclofen (LIORESAL) 10 MG tablet Take 1 tablet (10 mg total) by mouth 3 (three) times daily as needed for muscle spasms. 30 each 58, DO      PDMP not reviewed this encounter.   10-14-2003, Tommie Sams 05/10/21 [phone]

## 2021-05-10 NOTE — ED Triage Notes (Signed)
Patient complains of lower back pain and radiates down his leg at times x years but recently flared up.

## 2021-05-23 ENCOUNTER — Ambulatory Visit
Admission: EM | Admit: 2021-05-23 | Discharge: 2021-05-23 | Disposition: A | Discharge status: Home / Self Care | Payer: 59 | Attending: Emergency Medicine | Admitting: Emergency Medicine

## 2021-05-23 ENCOUNTER — Other Ambulatory Visit: Payer: Self-pay

## 2021-05-23 DIAGNOSIS — U071 COVID-19: Secondary | ICD-10-CM

## 2021-05-23 LAB — SARS CORONAVIRUS 2 (TAT 6-24 HRS): SARS Coronavirus 2: POSITIVE — AB

## 2021-05-23 MED ORDER — PROMETHAZINE-DM 6.25-15 MG/5ML PO SYRP: 5.0000 mL | ORAL_SOLUTION | Freq: Four times a day (QID) | ORAL | 0 refills | Status: DC | PRN

## 2021-05-23 MED ORDER — BENZONATATE 100 MG PO CAPS: 200.0000 mg | ORAL_CAPSULE | Freq: Three times a day (TID) | ORAL | 0 refills | Status: DC

## 2021-05-23 MED ORDER — IPRATROPIUM BROMIDE 0.06 % NA SOLN: 2.0000 | Freq: Four times a day (QID) | NASAL | 12 refills | Status: DC

## 2021-05-23 MED ORDER — NIRMATRELVIR/RITONAVIR (PAXLOVID)TABLET: 3.0000 | ORAL_TABLET | Freq: Two times a day (BID) | ORAL | 0 refills | Status: AC

## 2021-05-23 NOTE — ED Triage Notes (Signed)
Pt reports having nasal congestion, cough and headache since yesterday. + at home covid test yesterday. He was also had a +covid PCR 7/13.

## 2021-05-23 NOTE — Discharge Instructions (Addendum)
You will have to quarantine for 5 days from the start of your symptoms.  After 5 days you can break quarantine if your symptoms have improved and you have not had a fever for 24 hours without taking Tylenol or ibuprofen.  Use over-the-counter Tylenol and ibuprofen as needed for body aches and fever.  Use the Tessalon Perles during the day as needed for cough and the Promethazine DM cough syrup at nighttime as will make you drowsy.  Use the Atrovent nasal spray, 2 squirts in each nostril every 6 hours as needed for nasal congestion.  Take the Paxlovid as directed.  If you develop any increased shortness of breath-especially at rest, you are unable to speak in full sentences, or is a late sign your lips are turning blue you need to go the ER for evaluation.

## 2021-05-23 NOTE — ED Provider Notes (Signed)
[facility]    CSN: [id number] Arrival date & time: 05/23/21  0806      History   Chief Complaint Chief Complaint  Patient presents with   Nasal Congestion    HPI Tristan Thompson is a 55 y.o. male.   HPI  55 year old male here for evaluation of nasal congestion, nonproductive cough, and headache.  Patient reports that he developed the above symptoms yesterday with associated symptoms of runny nose for clear nasal discharge, scratchy throat, night sweats, nausea, and diarrhea.  He states that he had an elevated temp of 99 but no documented fever and he denies shortness of breath, wheezing, or vomiting.  Patient did take a home COVID test yesterday that was positive.  Patient also had COVID May 22, 2021 and states he was treated with Paxlovid at that time.    Past Medical History:  Diagnosis Date   Anxiety    Chronic back pain    Hypertension    has a prescription but has not had it filled.   Umbilical hernia    has had surgery to "patch" it    Patient Active Problem List   Diagnosis Date Noted   Alcohol dependence (HCC) 01/21/2017   Anxiety 01/21/2017   Chronic back pain 01/21/2017   Hypertension 01/21/2017   Seasonal allergies 01/21/2017    Past Surgical History:  Procedure Laterality Date   ANTERIOR CERVICAL DECOMP/DISCECTOMY FUSION N/A 07/16/2013   Procedure: ANTERIOR CERVICAL DECOMPRESSION/DISCECTOMY FUSION CERVICAL THREE-FOUR,FOUR-FIVE AND CERVICAL SIX-SEVEN;  Surgeon: Reinaldo Meeker, MD;  Location: [facility];  Service: Neurosurgery;  Laterality: N/A;   BACK SURGERY     ESOPHAGOGASTRODUODENOSCOPY N/A 01/04/2017   Procedure: ESOPHAGOGASTRODUODENOSCOPY (EGD);  Surgeon: Toney Reil, MD;  Location: [facility] ENDOSCOPY;  Service: Gastroenterology;  Laterality: N/A;   HERNIA REPAIR         Home Medications    Prior to Admission medications   Medication Sig Start Date End Date Taking? Authorizing Provider  benzonatate (TESSALON) 100 MG  capsule Take 2 capsules (200 mg total) by mouth every 8 (eight) hours. 05/23/21  Yes Becky Augusta, NP  ipratropium (ATROVENT) 0.06 % nasal spray Place 2 sprays into both nostrils 4 (four) times daily. 05/23/21  Yes Becky Augusta, NP  nirmatrelvir/ritonavir EUA (PAXLOVID) 20 x 150 MG & 10 x 100MG  TABS Take 3 tablets by mouth 2 (two) times daily for 5 days. Patient GFR is 77. Take nirmatrelvir (150 mg) two tablets twice daily for 5 days and ritonavir (100 mg) one tablet twice daily for 5 days. 05/23/21 05/28/21 Yes 05/30/21, NP  promethazine-dextromethorphan (PROMETHAZINE-DM) 6.25-15 MG/5ML syrup Take 5 mLs by mouth 4 (four) times daily as needed. 05/23/21  Yes 05/25/21, NP  baclofen (LIORESAL) 10 MG tablet Take 1 tablet (10 mg total) by mouth 3 (three) times daily as needed for muscle spasms. 05/10/21   07/10/21, DO  escitalopram (LEXAPRO) 5 MG tablet Take 5 mg by mouth daily.    [provider]  hydrochlorothiazide (MICROZIDE) 12.5 MG capsule Take 12.5 mg by mouth daily.    [provider]  predniSONE (STERAPRED UNI-PAK 21 TAB) 10 MG (21) TBPK tablet 6 tablets on day 1; decrease by 1 tablet daily until gone. 05/10/21   07/10/21, DO    Family History Family History  Problem Relation Age of Onset   Arthritis Mother    Hypertension Father     Social History Social History   Tobacco Use   Smoking  status: Every Day    Packs/day: 0.50    Types: Cigarettes   Smokeless tobacco: Never  Vaping Use   Vaping Use: Never used  Substance Use Topics   Alcohol use: Yes    Alcohol/week: 4.0 standard drinks    Types: 4 Cans of beer per week    Comment: six pack of beer/day   Drug use: No     Allergies   Patient has no known allergies.   Review of Systems Review of Systems  Constitutional:  Positive for diaphoresis. Negative for activity change, appetite change and fever.  HENT:  Positive for congestion, rhinorrhea and sore throat.   Respiratory:  Positive for cough.  Negative for shortness of breath and wheezing.   Gastrointestinal:  Positive for diarrhea and nausea. Negative for vomiting.  Neurological:  Positive for headaches.  Psychiatric/Behavioral: Negative.      Physical Exam Triage Vital Signs ED Triage Vitals  Enc Vitals Group     BP      Pulse      Resp      Temp      Temp src      SpO2      Weight      Height      Head Circumference      Peak Flow      Pain Score      Pain Loc      Pain Edu?      Excl. in GC?    No data found.  Updated Vital Signs BP (!) 152/102   Pulse 64   Temp 98.9 F (37.2 C) (Oral)   Resp 18   Ht 6\' 2"  (1.88 m)   Wt 212 lb (96.2 kg)   SpO2 100%   BMI 27.22 kg/m   Visual Acuity Right Eye Distance:   Left Eye Distance:   Bilateral Distance:    Right Eye Near:   Left Eye Near:    Bilateral Near:     Physical Exam Vitals and nursing note reviewed.  Constitutional:      General: He is not in acute distress.    Appearance: Normal appearance. He is not ill-appearing.  HENT:     Head: Normocephalic and atraumatic.     Right Ear: Tympanic membrane, ear canal and external ear normal. There is no impacted cerumen.     Left Ear: Tympanic membrane, ear canal and external ear normal. There is no impacted cerumen.     Nose: Congestion and rhinorrhea present.     Mouth/Throat:     Mouth: Mucous membranes are moist.     Pharynx: Oropharynx is clear. No posterior oropharyngeal erythema.  Cardiovascular:     Rate and Rhythm: Normal rate and regular rhythm.     Pulses: Normal pulses.     Heart sounds: Normal heart sounds. No murmur heard.   No gallop.  Pulmonary:     Effort: Pulmonary effort is normal.     Breath sounds: Normal breath sounds. No wheezing, rhonchi or rales.  Musculoskeletal:     Cervical back: Normal range of motion and neck supple.  Lymphadenopathy:     Cervical: No cervical adenopathy.  Skin:    General: Skin is warm and dry.     Capillary Refill: Capillary refill takes less  than 2 seconds.     Findings: No erythema or rash.  Neurological:     General: No focal deficit present.     Mental Status: He is alert and oriented to  person, place, and time.  Psychiatric:        Mood and Affect: Mood normal.        Behavior: Behavior normal.        Thought Content: Thought content normal.        Judgment: Judgment normal.     UC Treatments / Results  Labs (all labs ordered are listed, but only abnormal results are displayed) Labs Reviewed  SARS CORONAVIRUS 2 (TAT 6-24 HRS)    EKG   Radiology No results found.  Procedures Procedures (including critical care time)  Medications Ordered in UC Medications - No data to display  Initial Impression / Assessment and Plan / UC Course  I have reviewed the triage vital signs and the nursing notes.  Pertinent labs & imaging results that were available during my care of the patient were reviewed by me and considered in my medical decision making (see chart for details).  Patient is a nontoxic-appearing 55 year old male here for evaluation of respiratory complaints as outlined in HPI above.  Patient tested positive and was treated for COVID in July of this year.  He states that his respiratory symptoms developed yesterday and he took a home test which was also positive.  It is unclear if this is a new case of COVID or if this is simply a URI and he is testing positive because its been less than 90 days since his previous COVID infection.  Patient's physical exam reveals pearly gray tympanic membranes bilaterally with a normal light reflex and clear external auditory canals.  Nasal mucosa is mildly edematous without significant erythema and scant clear nasal discharge.  Oropharyngeal exam is benign.  Patient has no appreciable cervical lymphadenopathy on exam.  Cardiopulmonary exam reveals clear lung sounds in all fields.  Patient's workplace requires a PCR test so will swab patient for COVID and discharge him home following  CDC quarantine protocol of 5 days.  Will retreat with Paxlovid, Tessalon Perles Promethazine DM for cough, and Atrovent nasal spray for congestion.  Work note provided.   Final Clinical Impressions(s) / UC Diagnoses   Final diagnoses:  COVID-19     Discharge Instructions      You will have to quarantine for 5 days from the start of your symptoms.  After 5 days you can break quarantine if your symptoms have improved and you have not had a fever for 24 hours without taking Tylenol or ibuprofen.  Use over-the-counter Tylenol and ibuprofen as needed for body aches and fever.  Use the Tessalon Perles during the day as needed for cough and the Promethazine DM cough syrup at nighttime as will make you drowsy.  Use the Atrovent nasal spray, 2 squirts in each nostril every 6 hours as needed for nasal congestion.  Take the Paxlovid as directed.  If you develop any increased shortness of breath-especially at rest, you are unable to speak in full sentences, or is a late sign your lips are turning blue you need to go the ER for evaluation.      ED Prescriptions     Medication Sig Dispense Auth. Provider   nirmatrelvir/ritonavir EUA (PAXLOVID) 20 x 150 MG & 10 x 100MG  TABS Take 3 tablets by mouth 2 (two) times daily for 5 days. Patient GFR is 77. Take nirmatrelvir (150 mg) two tablets twice daily for 5 days and ritonavir (100 mg) one tablet twice daily for 5 days. 30 tablet , NP   ipratropium (ATROVENT) 0.06 % nasal spray Place  2 sprays into both nostrils 4 (four) times daily. 15 mL Becky Augusta, NP   benzonatate (TESSALON) 100 MG capsule Take 2 capsules (200 mg total) by mouth every 8 (eight) hours. 21 capsule Becky Augusta, NP   promethazine-dextromethorphan (PROMETHAZINE-DM) 6.25-15 MG/5ML syrup Take 5 mLs by mouth 4 (four) times daily as needed. 118 mL Becky Augusta, NP      PDMP not reviewed this encounter.   Becky Augusta, NP 05/23/21 [phone]

## 2021-07-26 ENCOUNTER — Encounter: Payer: Self-pay | Admitting: Emergency Medicine

## 2021-07-26 ENCOUNTER — Other Ambulatory Visit: Payer: Self-pay

## 2021-07-26 ENCOUNTER — Ambulatory Visit
Admission: EM | Admit: 2021-07-26 | Discharge: 2021-07-26 | Disposition: A | Discharge status: Home / Self Care | Payer: 59 | Attending: Internal Medicine | Admitting: Internal Medicine

## 2021-07-26 DIAGNOSIS — F17210 Nicotine dependence, cigarettes, uncomplicated: Secondary | ICD-10-CM | POA: Insufficient documentation

## 2021-07-26 DIAGNOSIS — R059 Cough, unspecified: Secondary | ICD-10-CM | POA: Insufficient documentation

## 2021-07-26 DIAGNOSIS — Z20822 Contact with and (suspected) exposure to covid-19: Secondary | ICD-10-CM | POA: Insufficient documentation

## 2021-07-26 DIAGNOSIS — R519 Headache, unspecified: Secondary | ICD-10-CM | POA: Diagnosis not present

## 2021-07-26 DIAGNOSIS — F419 Anxiety disorder, unspecified: Secondary | ICD-10-CM | POA: Insufficient documentation

## 2021-07-26 DIAGNOSIS — I10 Essential (primary) hypertension: Secondary | ICD-10-CM | POA: Insufficient documentation

## 2021-07-26 DIAGNOSIS — B349 Viral infection, unspecified: Secondary | ICD-10-CM

## 2021-07-26 DIAGNOSIS — R509 Fever, unspecified: Secondary | ICD-10-CM | POA: Insufficient documentation

## 2021-07-26 LAB — RESP PANEL BY RT-PCR (FLU A&B, COVID) ARPGX2
Influenza A by PCR: POSITIVE — AB
Influenza B by PCR: NEGATIVE
SARS Coronavirus 2 by RT PCR: NEGATIVE

## 2021-07-26 MED ORDER — BACLOFEN 5 MG PO TABS: 5.0000 mg | ORAL_TABLET | Freq: Two times a day (BID) | ORAL | 0 refills | Status: DC

## 2021-07-26 MED ORDER — IBUPROFEN 800 MG PO TABS: 800.0000 mg | ORAL_TABLET | Freq: Three times a day (TID) | ORAL | 0 refills | Status: DC

## 2021-07-26 NOTE — ED Provider Notes (Signed)
[facility]    CSN: [id number] Arrival date & time: 07/26/21  1038      History   Chief Complaint Chief Complaint  Patient presents with   Generalized Body Aches   Fever    HPI Tristan Thompson is a 55 y.o. male.   Patient presents with chills, fever, nasal congestion, rhinorrhea, nonproductive cough and intermittent generalized headaches for 1 day.  Has attempted use of tylenol cold and flu, somewhat helpful. No known sick contacts.  Poor appetite and minimal fluid intake.  History anxiety, hypertension.  Denies ear pain, shortness of breath, wheezing, abdominal pain, nausea, vomiting, diarrhea, sore throat.  Past Medical History:  Diagnosis Date   Anxiety    Chronic back pain    Hypertension    has a prescription but has not had it filled.   Umbilical hernia    has had surgery to "patch" it    Patient Active Problem List   Diagnosis Date Noted   Alcohol dependence (HCC) 01/21/2017   Anxiety 01/21/2017   Chronic back pain 01/21/2017   Hypertension 01/21/2017   Seasonal allergies 01/21/2017    Past Surgical History:  Procedure Laterality Date   ANTERIOR CERVICAL DECOMP/DISCECTOMY FUSION N/A 07/16/2013   Procedure: ANTERIOR CERVICAL DECOMPRESSION/DISCECTOMY FUSION CERVICAL THREE-FOUR,FOUR-FIVE AND CERVICAL SIX-SEVEN;  Surgeon: Reinaldo Meeker, MD;  Location: [facility];  Service: Neurosurgery;  Laterality: N/A;   BACK SURGERY     ESOPHAGOGASTRODUODENOSCOPY N/A 01/04/2017   Procedure: ESOPHAGOGASTRODUODENOSCOPY (EGD);  Surgeon: Toney Reil, MD;  Location: [facility] ENDOSCOPY;  Service: Gastroenterology;  Laterality: N/A;   HERNIA REPAIR         Home Medications    Prior to Admission medications   Medication Sig Start Date End Date Taking? Authorizing Provider  escitalopram (LEXAPRO) 5 MG tablet Take 5 mg by mouth daily.   Yes [provider]  hydrochlorothiazide (MICROZIDE) 12.5 MG capsule Take 12.5 mg by mouth daily.   Yes [provider]  baclofen (LIORESAL) 10 MG tablet Take 1 tablet (10 mg total) by mouth 3 (three) times daily as needed for muscle spasms. 05/10/21   Tommie Sams, DO  benzonatate (TESSALON) 100 MG capsule Take 2 capsules (200 mg total) by mouth every 8 (eight) hours. 05/23/21   Becky Augusta, NP  ipratropium (ATROVENT) 0.06 % nasal spray Place 2 sprays into both nostrils 4 (four) times daily. 05/23/21   Becky Augusta, NP  predniSONE (STERAPRED UNI-PAK 21 TAB) 10 MG (21) TBPK tablet 6 tablets on day 1; decrease by 1 tablet daily until gone. 05/10/21   Tommie Sams, DO  promethazine-dextromethorphan (PROMETHAZINE-DM) 6.25-15 MG/5ML syrup Take 5 mLs by mouth 4 (four) times daily as needed. 05/23/21   Becky Augusta, NP    Family History Family History  Problem Relation Age of Onset   Arthritis Mother    Hypertension Father     Social History Social History   Tobacco Use   Smoking status: Every Day    Packs/day: 0.50    Types: Cigarettes   Smokeless tobacco: Never  Vaping Use   Vaping Use: Never used  Substance Use Topics   Alcohol use: Yes    Alcohol/week: 4.0 standard drinks    Types: 4 Cans of beer per week    Comment: six pack of beer/day   Drug use: No     Allergies   Patient has no known allergies.   Review of Systems Review of Systems  Constitutional:  Positive for chills  and fever. Negative for activity change, appetite change, diaphoresis, fatigue and unexpected weight change.  HENT:  Positive for congestion and rhinorrhea. Negative for dental problem, drooling, ear discharge, ear pain, facial swelling, hearing loss, mouth sores, nosebleeds, postnasal drip, sinus pressure, sinus pain, sneezing, sore throat, tinnitus, trouble swallowing and voice change.   Respiratory:  Positive for cough. Negative for apnea, choking, chest tightness, shortness of breath, wheezing and stridor.   Cardiovascular: Negative.   Gastrointestinal: Negative.   Skin: Negative.   Neurological:  Positive  for headaches. Negative for dizziness, tremors, seizures, syncope, facial asymmetry, speech difficulty, weakness, light-headedness and numbness.    Physical Exam Triage Vital Signs ED Triage Vitals  Enc Vitals Group     BP 07/26/21 1130 (!) 159/75     Pulse Rate 07/26/21 1130 67     Resp 07/26/21 1130 20     Temp 07/26/21 1130 100.3 F (37.9 C)     Temp Source 07/26/21 1130 Oral     SpO2 07/26/21 1130 94 %     Weight 07/26/21 1127 212 lb 1.3 oz (96.2 kg)     Height 07/26/21 1127 6\' 2"  (1.88 m)     Head Circumference --      Peak Flow --      Pain Score 07/26/21 1126 8     Pain Loc --      Pain Edu? --      Excl. in GC? --    No data found.  Updated Vital Signs BP (!) 159/75 (BP Location: Left Arm)   Pulse 67   Temp 100.3 F (37.9 C) (Oral)   Resp 20   Ht 6\' 2"  (1.88 m)   Wt 212 lb 1.3 oz (96.2 kg)   SpO2 94%   BMI 27.23 kg/m   Visual Acuity Right Eye Distance:   Left Eye Distance:   Bilateral Distance:    Right Eye Near:   Left Eye Near:    Bilateral Near:     Physical Exam Constitutional:      Appearance: Normal appearance. He is normal weight.  HENT:     Head: Normocephalic.     Right Ear: Tympanic membrane, ear canal and external ear normal.     Left Ear: Tympanic membrane, ear canal and external ear normal.     Nose: Congestion and rhinorrhea present.     Mouth/Throat:     Mouth: Mucous membranes are moist.     Pharynx: Posterior oropharyngeal erythema present.  Eyes:     Extraocular Movements: Extraocular movements intact.  Cardiovascular:     Rate and Rhythm: Normal rate and regular rhythm.     Pulses: Normal pulses.     Heart sounds: Normal heart sounds.  Pulmonary:     Effort: Pulmonary effort is normal.     Breath sounds: Normal breath sounds.  Musculoskeletal:     Cervical back: Normal range of motion and neck supple.  Skin:    General: Skin is warm and dry.  Neurological:     Mental Status: He is alert and oriented to person, place,  and time. Mental status is at baseline.  Psychiatric:        Mood and Affect: Mood normal.        Behavior: Behavior normal.     UC Treatments / Results  Labs (all labs ordered are listed, but only abnormal results are displayed) Labs Reviewed  RESP PANEL BY RT-PCR (FLU A&B, COVID) ARPGX2    EKG   Radiology  No results found.  Procedures Procedures (including critical care time)  Medications Ordered in UC Medications - No data to display  Initial Impression / Assessment and Plan / UC Course  I have reviewed the triage vital signs and the nursing notes.  Pertinent labs & imaging results that were available during my care of the patient were reviewed by me and considered in my medical decision making (see chart for details). Viral illness  Covid and flu test pending, discussed antivirals, would like Tamiflu if positive Ibuprofen 800 mg 3 times daily as needed Baclofen 5 mg twice daily as needed Work note given Urgent care follow-up as needed Over-the-counter medications for remaining symptom management  Final Clinical Impressions(s) / UC Diagnoses   Final diagnoses:  None   Discharge Instructions   None    ED Prescriptions   None    PDMP not reviewed this encounter.   Valinda Hoar, NP 07/26/21 1209

## 2021-07-26 NOTE — Discharge Instructions (Signed)
We will contact you if your COVID test is positive.  Please quarantine while you wait for the results.  If your test is negative you may resume normal activities.  If your test is positive please continue to quarantine for at least 5 days from your symptom onset or until you are without a fever for at least 24 hours after the medications.  May use ibuprofen every 8 hours as needed, may use in addition to tylenol   May use muscle relaxer twice a day as needed, be mindful this may make you drowsy     For cough: honey 1/2 to 1 teaspoon (you can dilute the honey in water or another fluid).  You can also use guaifenesin and dextromethorphan for cough. You can use a humidifier for chest congestion and cough.  If you don't have a humidifier, you can sit in the bathroom with the hot shower running.      For sore throat: try warm salt water gargles, cepacol lozenges, throat spray, warm tea or water with lemon/honey, popsicles or ice, or OTC cold relief medicine for throat discomfort.   For congestion: take a daily anti-histamine like Zyrtec, Claritin, and a oral decongestant, such as pseudoephedrine.  You can also use Flonase 1-2 sprays in each nostril daily.   It is important to stay hydrated: drink plenty of fluids (water, gatorade/powerade/pedialyte, juices, or teas) to keep your throat moisturized and help further relieve irritation/discomfort.

## 2021-07-26 NOTE — ED Triage Notes (Signed)
Pt c/o cough, body aches, fever (102-104), nasal congestion, runny nose, and vomiting. Started yesterday.

## 2021-10-13 ENCOUNTER — Encounter: Payer: Self-pay | Admitting: Emergency Medicine

## 2021-10-13 ENCOUNTER — Ambulatory Visit
Admission: EM | Admit: 2021-10-13 | Discharge: 2021-10-13 | Disposition: A | Discharge status: Home / Self Care | Payer: 59 | Attending: Physician Assistant | Admitting: Physician Assistant

## 2021-10-13 ENCOUNTER — Other Ambulatory Visit: Payer: Self-pay

## 2021-10-13 DIAGNOSIS — J00 Acute nasopharyngitis [common cold]: Secondary | ICD-10-CM | POA: Diagnosis present

## 2021-10-13 DIAGNOSIS — R197 Diarrhea, unspecified: Secondary | ICD-10-CM | POA: Diagnosis present

## 2021-10-13 DIAGNOSIS — Z20822 Contact with and (suspected) exposure to covid-19: Secondary | ICD-10-CM | POA: Diagnosis not present

## 2021-10-13 DIAGNOSIS — J069 Acute upper respiratory infection, unspecified: Secondary | ICD-10-CM

## 2021-10-13 DIAGNOSIS — R112 Nausea with vomiting, unspecified: Secondary | ICD-10-CM | POA: Diagnosis present

## 2021-10-13 LAB — RESP PANEL BY RT-PCR (FLU A&B, COVID) ARPGX2
Influenza A by PCR: NEGATIVE
Influenza B by PCR: NEGATIVE
SARS Coronavirus 2 by RT PCR: NEGATIVE

## 2021-10-13 MED ORDER — ONDANSETRON HCL 4 MG PO TABS: 4.0000 mg | ORAL_TABLET | Freq: Four times a day (QID) | ORAL | 0 refills | Status: DC

## 2021-10-13 MED ORDER — BENZONATATE 100 MG PO CAPS: 100.0000 mg | ORAL_CAPSULE | Freq: Three times a day (TID) | ORAL | 0 refills | Status: DC

## 2021-10-13 NOTE — ED Provider Notes (Signed)
[facility]    CSN: [id number] Arrival date & time: 10/13/21  0623      History   Chief Complaint Chief Complaint  Patient presents with   Headache   Nasal Congestion    HPI Tristan Thompson is a 56 y.o. male.   HPI  Cold Symptoms: Patient reports that they have had symptoms of sinus headaches, nasal congestion, diarrhea, nausea for the past 3 days. Symptoms are a bit improved as diarrhea has slowed and vomiting has resolved and was not described as non-bloody. They deny SOB, chest pain, fever. They have tried tylenol, flonase, pepto for symptoms. Wife is sick with similar symptoms.    Past Medical History:  Diagnosis Date   Anxiety    Chronic back pain    Hypertension    has a prescription but has not had it filled.   Umbilical hernia    has had surgery to "patch" it    Patient Active Problem List   Diagnosis Date Noted   Alcohol dependence (HCC) 01/21/2017   Anxiety 01/21/2017   Chronic back pain 01/21/2017   Hypertension 01/21/2017   Seasonal allergies 01/21/2017    Past Surgical History:  Procedure Laterality Date   ANTERIOR CERVICAL DECOMP/DISCECTOMY FUSION N/A 07/16/2013   Procedure: ANTERIOR CERVICAL DECOMPRESSION/DISCECTOMY FUSION CERVICAL THREE-FOUR,FOUR-FIVE AND CERVICAL SIX-SEVEN;  Surgeon: Reinaldo Meeker, MD;  Location: [facility];  Service: Neurosurgery;  Laterality: N/A;   BACK SURGERY     ESOPHAGOGASTRODUODENOSCOPY N/A 01/04/2017   Procedure: ESOPHAGOGASTRODUODENOSCOPY (EGD);  Surgeon: Toney Reil, MD;  Location: [facility] ENDOSCOPY;  Service: Gastroenterology;  Laterality: N/A;   HERNIA REPAIR         Home Medications    Prior to Admission medications   Medication Sig Start Date End Date Taking? Authorizing Provider  escitalopram (LEXAPRO) 5 MG tablet Take 5 mg by mouth daily.   Yes [provider]  hydrochlorothiazide (MICROZIDE) 12.5 MG capsule Take 12.5 mg by mouth daily.   Yes [provider]  baclofen 5  MG TABS Take 5 mg by mouth 2 (two) times daily. 07/26/21   White, Elita Boone, NP  ibuprofen (ADVIL) 800 MG tablet Take 1 tablet (800 mg total) by mouth 3 (three) times daily. 07/26/21   Valinda Hoar, NP    Family History Family History  Problem Relation Age of Onset   Arthritis Mother    Hypertension Father     Social History Social History   Tobacco Use   Smoking status: Every Day    Packs/day: 0.50    Types: Cigarettes   Smokeless tobacco: Never  Vaping Use   Vaping Use: Never used  Substance Use Topics   Alcohol use: Yes    Alcohol/week: 4.0 standard drinks    Types: 4 Cans of beer per week    Comment: six pack of beer/day   Drug use: No     Allergies   Patient has no known allergies.   Review of Systems Review of Systems  As stated above in HPI Physical Exam Triage Vital Signs ED Triage Vitals  Enc Vitals Group     BP 10/13/21 0831 (!) 167/89     Pulse Rate 10/13/21 0831 60     Resp 10/13/21 0831 15     Temp 10/13/21 0831 98.2 F (36.8 C)     Temp Source 10/13/21 0831 Oral     SpO2 10/13/21 0831 100 %     Weight 10/13/21 0829 210 lb (95.3 kg)  Height 10/13/21 0829 6\' 2"  (1.88 m)     Head Circumference --      Peak Flow --      Pain Score 10/13/21 0829 5     Pain Loc --      Pain Edu? --      Excl. in GC? --    No data found.  Updated Vital Signs BP (!) 167/89 (BP Location: Left Arm)    Pulse 60    Temp 98.2 F (36.8 C) (Oral)    Resp 15    Ht 6\' 2"  (1.88 m)    Wt 210 lb (95.3 kg)    SpO2 100%    BMI 26.96 kg/m   Physical Exam Vitals and nursing note reviewed.  Constitutional:      General: He is not in acute distress.    Appearance: He is well-developed.  HENT:     Head: Normocephalic and atraumatic.     Right Ear: Tympanic membrane normal.     Left Ear: Tympanic membrane normal.     Nose: Congestion and rhinorrhea present.     Mouth/Throat:     Mouth: Mucous membranes are moist.     Pharynx: Oropharynx is clear. No  oropharyngeal exudate or posterior oropharyngeal erythema.  Eyes:     Conjunctiva/sclera: Conjunctivae normal.  Cardiovascular:     Rate and Rhythm: Normal rate and regular rhythm.     Heart sounds: No murmur heard. Pulmonary:     Effort: Pulmonary effort is normal. No respiratory distress.     Breath sounds: Normal breath sounds.  Abdominal:     General: Bowel sounds are normal. There is no distension.     Palpations: Abdomen is soft.     Tenderness: There is no abdominal tenderness. There is no guarding.  Musculoskeletal:        General: No swelling.     Cervical back: Neck supple.  Lymphadenopathy:     Cervical: No cervical adenopathy.  Skin:    General: Skin is warm and dry.     Capillary Refill: Capillary refill takes less than 2 seconds.  Neurological:     Mental Status: He is alert.  Psychiatric:        Mood and Affect: Mood normal.     UC Treatments / Results  Labs (all labs ordered are listed, but only abnormal results are displayed) Labs Reviewed  RESP PANEL BY RT-PCR (FLU A&B, COVID) ARPGX2    EKG   Radiology No results found.  Procedures Procedures (including critical care time)  Medications Ordered in UC Medications - No data to display  Initial Impression / Assessment and Plan / UC Course  I have reviewed the triage vital signs and the nursing notes.  Pertinent labs & imaging results that were available during my care of the patient were reviewed by me and considered in my medical decision making (see chart for details).     New. Likely viral in nature. Treating with tessalon, zofran should nausea return, rest and hydration. Follow up PRN.  Final Clinical Impressions(s) / UC Diagnoses   Final diagnoses:  None   Discharge Instructions   None    ED Prescriptions   None    PDMP not reviewed this encounter.   12/11/21, 10/13/21 [phone]

## 2021-10-13 NOTE — ED Triage Notes (Signed)
Patient reports HAs, sinus pressure and pain and nasal congestion that started 3 days ago.  Patient states that he did have a tooth pulled on Monday.  Patient denies fevers.

## 2021-12-02 IMAGING — MR MR LUMBAR SPINE W/O CM
4 of 5 series · 30 of 48 positions shown · non-contrast
Comparison: Lumbar spine radiographs 12/22/2016. Lumbar spine MRI
04/24/2013.

CLINICAL DATA: Chronic bilateral low back pain with right-sided
sciatica. Additional history provided by scanning [HOSPITAL]-13 years, relatively new right leg weakness.

EXAM:
MRI LUMBAR SPINE WITHOUT CONTRAST
TECHNIQUE: Multiplanar, multisequence MR imaging of the lumbar spine was
performed. No intravenous contrast was administered.

[Series 5: T2 · sagittal · 4.0mm · 0.81mm/px · 6 of 17 slices shown (1 of 2)]
[im 1/17]
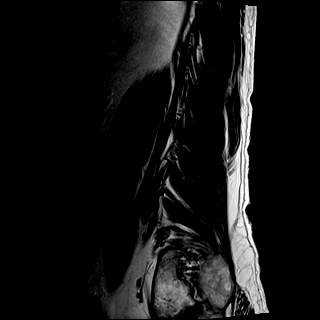
[im 4/17]
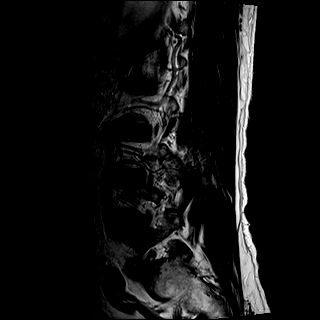
[im 7/17]
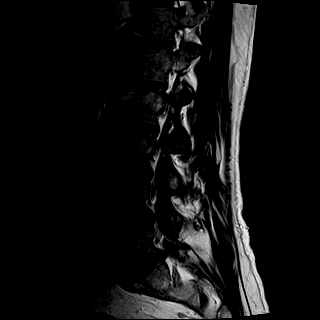
[im 10/17]
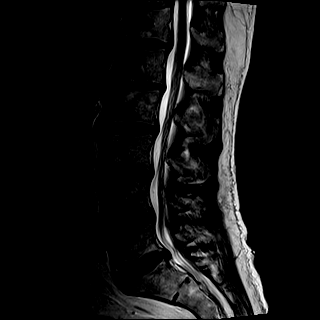
[im 13/17]
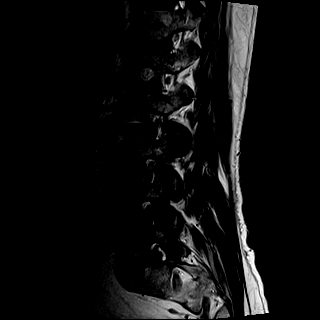
[im 17/17]
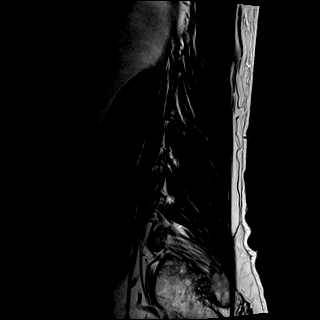

[Series 6: T1 · sagittal · 4.0mm · 0.81mm/px · 6 of 17 slices shown (1 of 2)]
[im 1/17]
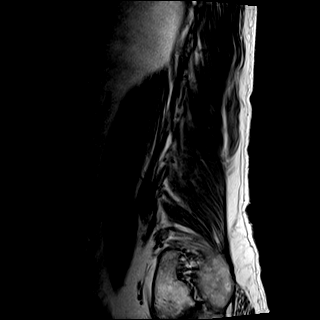
[im 4/17]
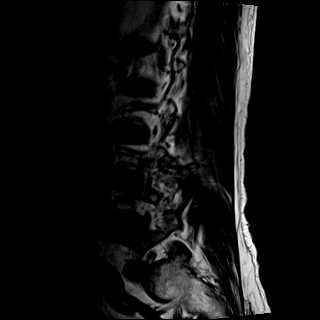
[im 7/17]
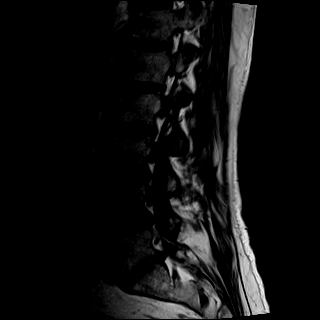
[im 10/17]
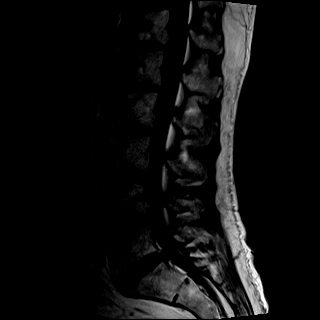
[im 13/17]
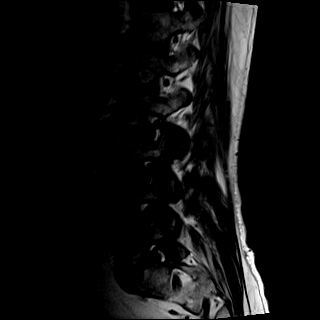
[im 17/17]
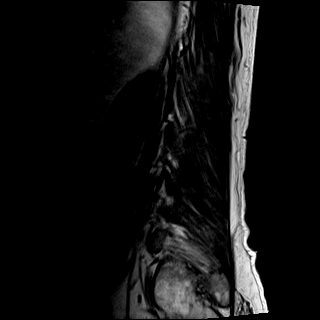

[Series 8: T2 · axial · 4.0mm · 0.78mm/px · z∈[-181,+46]mm · 9 of 40 slices shown (2 of 2)]
[im 1/40]
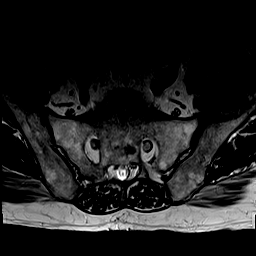
[im 6/40]
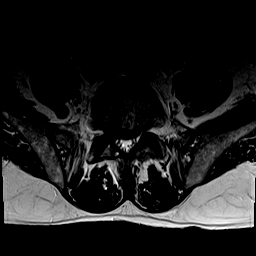
[im 12/40]
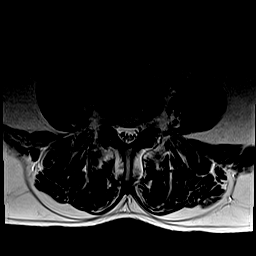
[im 17/40]
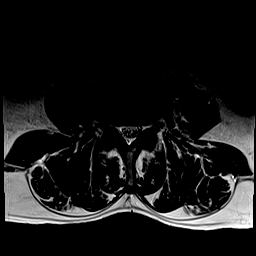
[im 20/40]
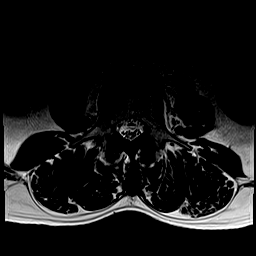
[im 23/40]
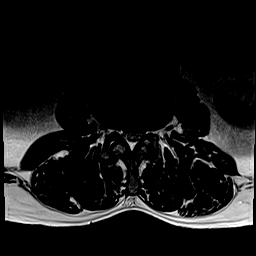
[im 28/40]
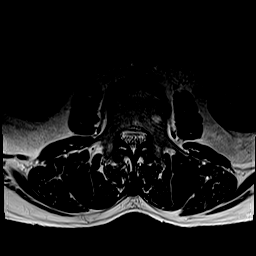
[im 34/40]
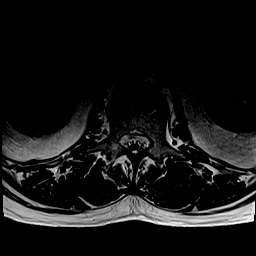
[im 40/40]
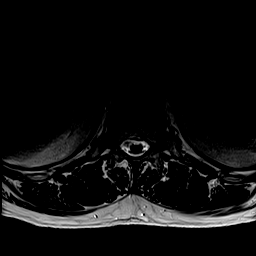

[Series 9: T1 · axial · 4.0mm · 0.39mm/px · z∈[-181,+46]mm · 9 of 40 slices shown (2 of 2)]
[im 1/40]
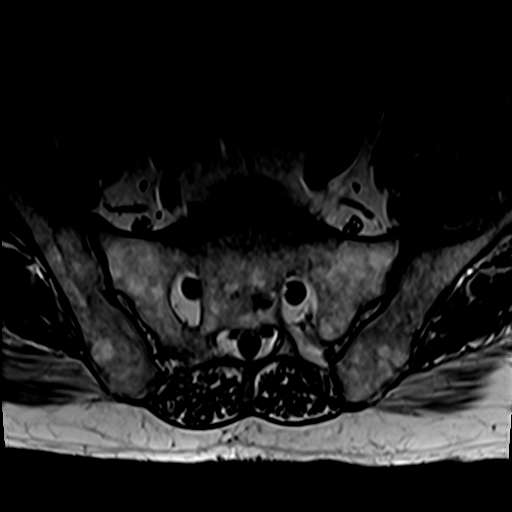
[im 6/40]
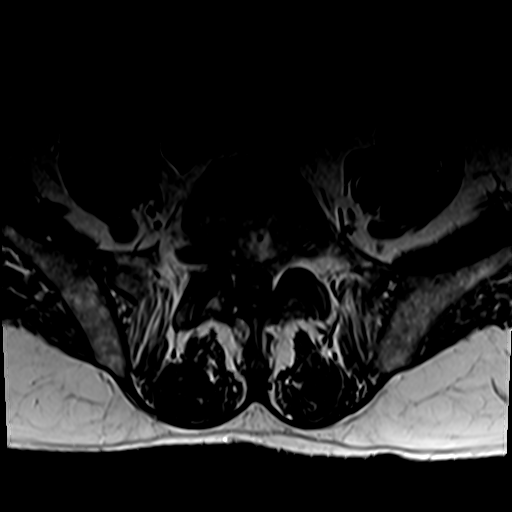
[im 12/40]
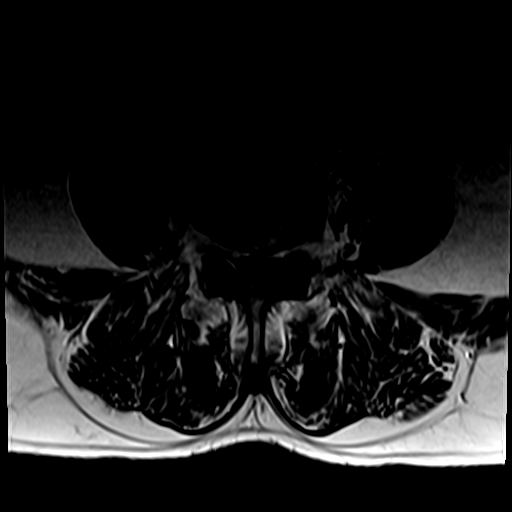
[im 17/40]
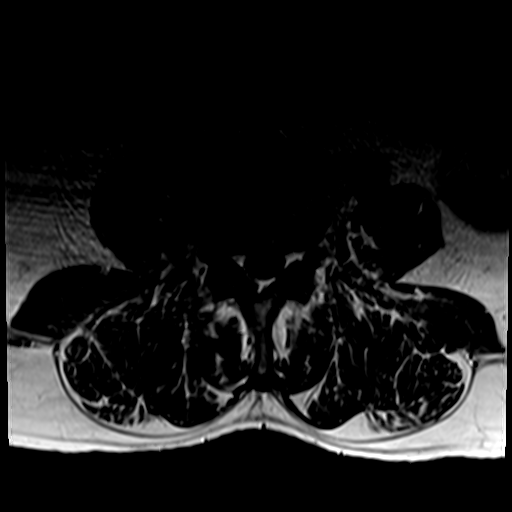
[im 20/40]
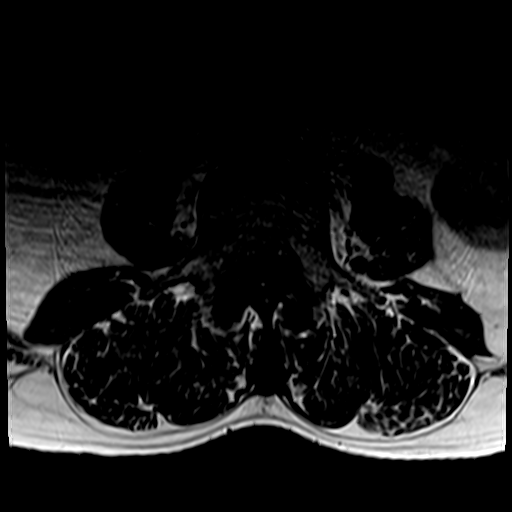
[im 23/40]
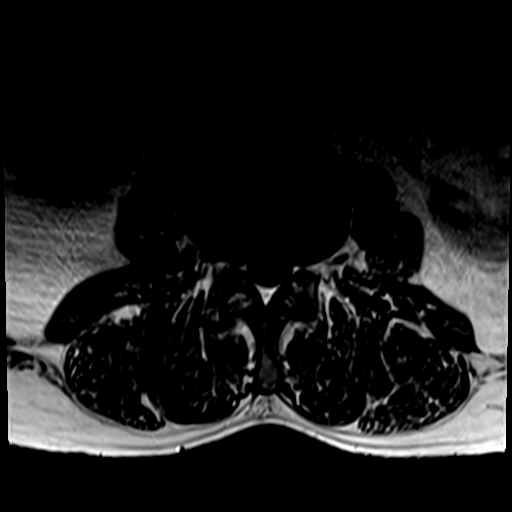
[im 28/40]
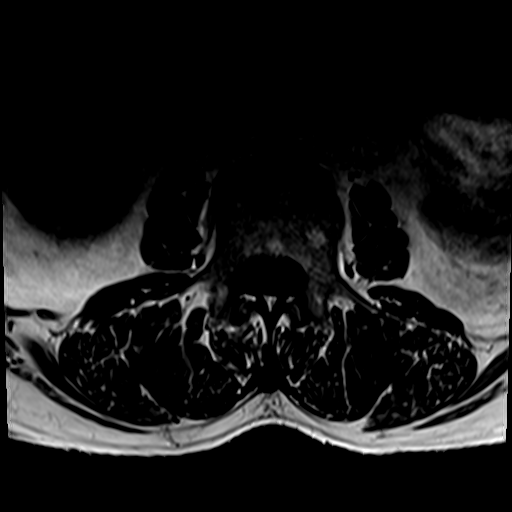
[im 34/40]
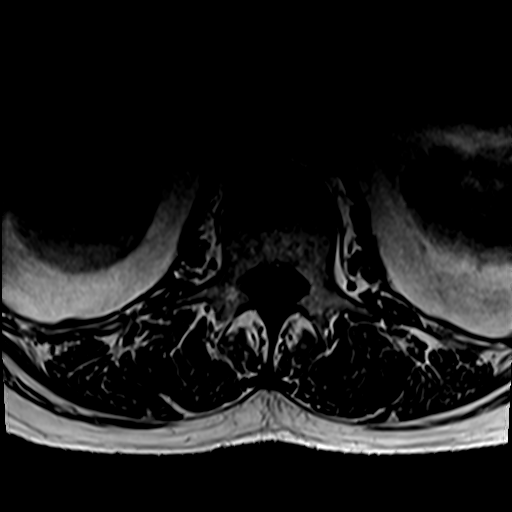
[im 40/40]
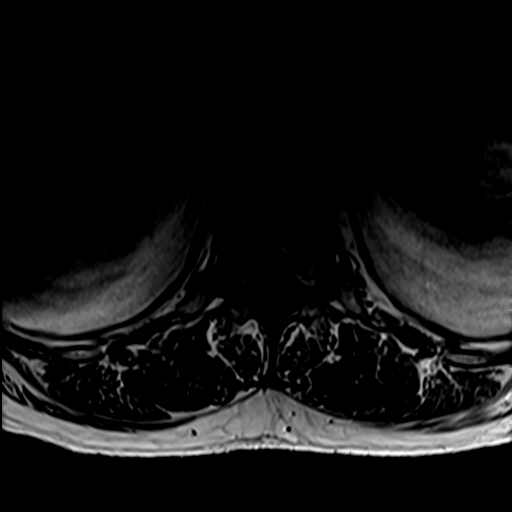

[30 of 48 positions shown; findings below may reference images not displayed]

FINDINGS: Segmentation: 5 lumbar vertebrae. The caudal most well-formed
intervertebral disc space is designated L5-S1.

Alignment: Subtle lumbar dextrocurvature. Trace T12-L1, L1-L2, L2-L3
and L3-L4 grade 1 retrolisthesis.

Vertebrae: Vertebral body height is maintained. Mild degenerative
endplate edema at L1-L2. Small Schmorl node within the L1 inferior
endplate. Elsewhere, no significant marrow edema or focal suspicious
osseous lesion is identified. Small multilevel vertebral body
hemangiomas. Multilevel ventral osteophytes.

Conus medullaris and cauda equina: Conus extends to the L2 level. No
signal abnormality within the visualized distal spinal cord.

Paraspinal and other soft tissues: No abnormality identified within
included portions of the abdomen/retroperitoneum. Paraspinal soft
tissues within normal limits.

Disc levels:

Unless otherwise stated, the level by level findings below have not
significantly changed since prior MRI 04/24/2013.

Multilevel disc degeneration. Most notably, moderate disc
degeneration is present at T12-L1 and L1-L2, and mild/moderate disc
degeneration is present at L2-L3.

Borderline congenitally narrow lumbar spinal canal.

T12-L1: Trace grade 1 retrolisthesis. Disc bulge. Superimposed small
left center disc protrusion at site of posterior annular fissure.
Mild partial effacement of the ventral thecal sac on the left
without spinal cord mass effect. No significant foraminal stenosis.

L1-L2: Trace grade 1 retrolisthesis. Small disc bulge. No
significant spinal canal or foraminal stenosis.

L2-L3: Trace grade 1 retrolisthesis. Disc bulge. Mild bilateral
subarticular and central canal stenosis, slightly progressed. Mild
right inferior neural foraminal narrowing, unchanged.

L3-L4: Small disc bulge, progressed. Superimposed tiny central disc
extrusion with slight cranial migration, new from the prior exam
(series 5, image 9). Superimposed broad-based right
foraminal/extraforaminal disc protrusion, also new. Mild facet
arthrosis/ligamentum flavum hypertrophy. Progressive mild partial
effacement of the ventral thecal sac without nerve root impingement.
Mild bilateral neural foraminal narrowing, new.

L4-L5: Disc bulge. Facet arthrosis (mild right, mild/moderate left).
Mild relative bilateral subarticular and central canal narrowing
without nerve root impingement. Mild relative bilateral neural
foraminal narrowing.

L5-S1: Disc bulge with endplate spurring. Superimposed broad-based
central disc protrusion slightly eccentric to the right at site of
posterior annular fissure, slightly regressed. Mild facet arthrosis.
Although improved from the prior exam, the disc protrusion
contributes to mild relative right subarticular narrowing and may
contact the descending right S1 nerve root (series 8, image 36).
Central canal patent. Mild right neural foraminal narrowing.
IMPRESSION: At L5-S1, there is a shallow broad-based central disc protrusion
eccentric to the right. This disc protrusion has somewhat regressed
since the prior MRI of 04/24/2013. Although improved, the disc
protrusion contributes to mild relative right subarticular
narrowing, and may contact the descending right S1 nerve root.

Lumbar and lower thoracic spondylosis at the remaining visualized
levels, as outlined. No more than mild spinal canal stenosis. L3-L4
spondylosis has slightly progressed with new mild bilateral neural
foraminal narrowing at this level. Additional sites of mild neural
foraminal narrowing, as detailed.

Disc degeneration is greatest at T12-L1 and L1-L2 (moderate).

Mild degenerative endplate irregularity and degenerative endplate
edema at L1-L2.

## 2021-12-28 DIAGNOSIS — F17200 Nicotine dependence, unspecified, uncomplicated: Secondary | ICD-10-CM | POA: Insufficient documentation

## 2021-12-31 DIAGNOSIS — R7303 Prediabetes: Secondary | ICD-10-CM | POA: Insufficient documentation

## 2021-12-31 DIAGNOSIS — E782 Mixed hyperlipidemia: Secondary | ICD-10-CM | POA: Insufficient documentation

## 2022-04-11 DIAGNOSIS — N528 Other male erectile dysfunction: Secondary | ICD-10-CM | POA: Insufficient documentation

## 2022-04-23 ENCOUNTER — Ambulatory Visit
Admission: EM | Admit: 2022-04-23 | Discharge: 2022-04-23 | Disposition: A | Discharge status: Home / Self Care | Payer: 59 | Attending: Physician Assistant | Admitting: Physician Assistant

## 2022-04-23 DIAGNOSIS — R197 Diarrhea, unspecified: Secondary | ICD-10-CM | POA: Insufficient documentation

## 2022-04-23 DIAGNOSIS — R051 Acute cough: Secondary | ICD-10-CM | POA: Insufficient documentation

## 2022-04-23 DIAGNOSIS — U071 COVID-19: Secondary | ICD-10-CM | POA: Insufficient documentation

## 2022-04-23 LAB — SARS CORONAVIRUS 2 BY RT PCR: SARS Coronavirus 2 by RT PCR: POSITIVE — AB

## 2022-04-23 MED ORDER — MOLNUPIRAVIR 200 MG PO CAPS: 4.0000 | ORAL_CAPSULE | Freq: Two times a day (BID) | ORAL | 0 refills | Status: AC

## 2022-04-23 MED ORDER — PROMETHAZINE-DM 6.25-15 MG/5ML PO SYRP: 5.0000 mL | ORAL_SOLUTION | Freq: Four times a day (QID) | ORAL | 0 refills | Status: DC | PRN

## 2022-04-23 NOTE — Discharge Instructions (Addendum)
-  Your COVID test is positive.  Need to isolate 5 days and wear mask for 5 days. - I have sent COVID antiviral medicine to the pharmacy as well as a cough medicine. - Increase rest and fluid intake. - You should be feeling better after the next 1 week.  If your symptoms acutely worsen or your breathing difficulty you should be seen again and reexamined either here in the emergency department.

## 2022-04-23 NOTE — ED Triage Notes (Signed)
Patient reports that he has diarrhea, headaches, nasal congestion, and cough -- Symptoms started Sunday.   Patient reports that this feels like the last time he had COVID.

## 2022-04-23 NOTE — ED Provider Notes (Signed)
[facility]    CSN: [id number] Arrival date & time: 04/23/22  0801      History   Chief Complaint Chief Complaint  Patient presents with   Diarrhea   Cough   Headache   Nasal Congestion    HPI Tristan Thompson is a 56 y.o. male for 2-day history of diarrhea, headaches, fatigue, low-grade temps of 100 degrees, nasal congestion and cough.  Patient denies sore throat, sinus pain, breathing difficulty.  He reports that he feels like he did when he had COVID and he has heard that it has been going on around his workplace.  He took Advil yesterday but has not taken any other medication for symptoms.  He has a history of anxiety, chronic back pain, hypertension.  HPI  Past Medical History:  Diagnosis Date   Anxiety    Chronic back pain    Hypertension    has a prescription but has not had it filled.   Umbilical hernia    has had surgery to "patch" it    Patient Active Problem List   Diagnosis Date Noted   Alcohol dependence (HCC) 01/21/2017   Anxiety 01/21/2017   Chronic back pain 01/21/2017   Hypertension 01/21/2017   Seasonal allergies 01/21/2017    Past Surgical History:  Procedure Laterality Date   ANTERIOR CERVICAL DECOMP/DISCECTOMY FUSION N/A 07/16/2013   Procedure: ANTERIOR CERVICAL DECOMPRESSION/DISCECTOMY FUSION CERVICAL THREE-FOUR,FOUR-FIVE AND CERVICAL SIX-SEVEN;  Surgeon: Reinaldo Meeker, MD;  Location: [facility];  Service: Neurosurgery;  Laterality: N/A;   BACK SURGERY     ESOPHAGOGASTRODUODENOSCOPY N/A 01/04/2017   Procedure: ESOPHAGOGASTRODUODENOSCOPY (EGD);  Surgeon: Toney Reil, MD;  Location: [facility] ENDOSCOPY;  Service: Gastroenterology;  Laterality: N/A;   HERNIA REPAIR         Home Medications    Prior to Admission medications   Medication Sig Start Date End Date Taking? Authorizing Provider  escitalopram (LEXAPRO) 5 MG tablet Take 5 mg by mouth daily.   Yes [provider]  hydrochlorothiazide (MICROZIDE) 12.5 MG  capsule Take 12.5 mg by mouth daily.   Yes [provider]  molnupiravir EUA (LAGEVRIO) 200 MG CAPS capsule Take 4 capsules (800 mg total) by mouth 2 (two) times daily for 5 days. 04/23/22 04/28/22 Yes Shirlee Latch, PA-C  promethazine-dextromethorphan (PROMETHAZINE-DM) 6.25-15 MG/5ML syrup Take 5 mLs by mouth 4 (four) times daily as needed. 04/23/22  Yes Eusebio Friendly B, PA-C  baclofen 5 MG TABS Take 5 mg by mouth 2 (two) times daily. 07/26/21   White, Elita Boone, NP  ibuprofen (ADVIL) 800 MG tablet Take 1 tablet (800 mg total) by mouth 3 (three) times daily. 07/26/21   White, Elita Boone, NP  ondansetron (ZOFRAN) 4 MG tablet Take 1 tablet (4 mg total) by mouth every 6 (six) hours. 10/13/21   Rushie Chestnut, PA-C    Family History Family History  Problem Relation Age of Onset   Arthritis Mother    Hypertension Father     Social History Social History   Tobacco Use   Smoking status: Every Day    Packs/day: 0.50    Types: Cigarettes   Smokeless tobacco: Never  Vaping Use   Vaping Use: Never used  Substance Use Topics   Alcohol use: Yes    Alcohol/week: 4.0 standard drinks of alcohol    Types: 4 Cans of beer per week    Comment: six pack of beer/day   Drug use: No     Allergies  Patient has no known allergies.   Review of Systems Review of Systems  Constitutional:  Positive for fatigue and fever (100 degrees).  HENT:  Positive for congestion and rhinorrhea. Negative for sinus pressure, sinus pain and sore throat.   Respiratory:  Positive for cough. Negative for shortness of breath.   Gastrointestinal:  Positive for diarrhea. Negative for abdominal pain, nausea and vomiting.  Musculoskeletal:  Negative for myalgias.  Neurological:  Positive for headaches. Negative for weakness and light-headedness.  Hematological:  Negative for adenopathy.     Physical Exam Triage Vital Signs ED Triage Vitals  Enc Vitals Group     BP 04/23/22 0816 (!) 151/80     Pulse  Rate 04/23/22 0816 66     Resp --      Temp 04/23/22 0816 98.4 F (36.9 C)     Temp Source 04/23/22 0816 Oral     SpO2 04/23/22 0816 99 %     Weight 04/23/22 0813 212 lb (96.2 kg)     Height 04/23/22 0813 6\' 2"  (1.88 m)     Head Circumference --      Peak Flow --      Pain Score 04/23/22 0812 6     Pain Loc --      Pain Edu? --      Excl. in GC? --    No data found.  Updated Vital Signs BP (!) 151/80 (BP Location: Left Arm)   Pulse 66   Temp 98.4 F (36.9 C) (Oral)   Ht 6\' 2"  (1.88 m)   Wt 212 lb (96.2 kg)   SpO2 99%   BMI 27.22 kg/m       Physical Exam Vitals and nursing note reviewed.  Constitutional:      General: He is not in acute distress.    Appearance: Normal appearance. He is well-developed. He is ill-appearing.  HENT:     Head: Normocephalic and atraumatic.     Nose: Congestion present.     Mouth/Throat:     Mouth: Mucous membranes are moist.     Pharynx: Oropharynx is clear.  Eyes:     General: No scleral icterus.    Conjunctiva/sclera: Conjunctivae normal.  Cardiovascular:     Rate and Rhythm: Normal rate and regular rhythm.     Heart sounds: Normal heart sounds.  Pulmonary:     Effort: Pulmonary effort is normal. No respiratory distress.     Breath sounds: Normal breath sounds.  Abdominal:     Palpations: Abdomen is soft.     Tenderness: There is no abdominal tenderness.  Musculoskeletal:     Cervical back: Neck supple.  Skin:    General: Skin is warm and dry.     Capillary Refill: Capillary refill takes less than 2 seconds.  Neurological:     General: No focal deficit present.     Mental Status: He is alert. Mental status is at baseline.     Motor: No weakness.     Gait: Gait normal.  Psychiatric:        Mood and Affect: Mood normal.        Behavior: Behavior normal.      UC Treatments / Results  Labs (all labs ordered are listed, but only abnormal results are displayed) Labs Reviewed  SARS CORONAVIRUS 2 BY RT PCR - Abnormal;  Notable for the following components:      Result Value   SARS Coronavirus 2 by RT PCR POSITIVE (*)    All  other components within normal limits    EKG   Radiology No results found.  Procedures Procedures (including critical care time)  Medications Ordered in UC Medications - No data to display  Initial Impression / Assessment and Plan / UC Course  I have reviewed the triage vital signs and the nursing notes.  Pertinent labs & imaging results that were available during my care of the patient were reviewed by me and considered in my medical decision making (see chart for details).   56 year old male presenting for 2-day history of diarrhea, headaches, fatigue, low-grade fever, nasal congestion and cough.  He is afebrile and mildly ill-appearing but nontoxic.  On exam he does have nasal congestion.  Chest clear to auscultation heart regular rate and rhythm.  PCR COVID test performed.  Positive result.  Discussed result with patient.  Isolating for 5 days and wear a mask for 5 days.  Sent molnupiravir to pharmacy as he has certain risk factors including being a little overweight and having hypertension.  Encouraged him to increase rest and fluids.  Reviewed return and ER precautions relating to COVID.   Final Clinical Impressions(s) / UC Diagnoses   Final diagnoses:  COVID-19  Acute cough  Diarrhea, unspecified type     Discharge Instructions      -Your COVID test is positive.  Need to isolate 5 days and wear mask for 5 days. - I have sent COVID antiviral medicine to the pharmacy as well as a cough medicine. - Increase rest and fluid intake. - You should be feeling better after the next 1 week.  If your symptoms acutely worsen or your breathing difficulty you should be seen again and reexamined either here in the emergency department.     ED Prescriptions     Medication Sig Dispense Auth. Provider   promethazine-dextromethorphan (PROMETHAZINE-DM) 6.25-15 MG/5ML  syrup Take 5 mLs by mouth 4 (four) times daily as needed. 118 mL Eusebio Friendly B, PA-C   molnupiravir EUA (LAGEVRIO) 200 MG CAPS capsule Take 4 capsules (800 mg total) by mouth 2 (two) times daily for 5 days. 40 capsule Shirlee Latch, PA-C      PDMP not reviewed this encounter.   Shirlee Latch, PA-C 04/23/22 [phone]

## 2022-06-15 ENCOUNTER — Other Ambulatory Visit: Payer: Self-pay | Admitting: Family Medicine

## 2022-06-15 DIAGNOSIS — M5416 Radiculopathy, lumbar region: Secondary | ICD-10-CM

## 2022-07-03 ENCOUNTER — Ambulatory Visit
Admission: RE | Admit: 2022-07-03 | Discharge: 2022-07-03 | Disposition: A | Discharge status: Home / Self Care | Payer: 59 | Source: Ambulatory Visit | Attending: Family Medicine | Admitting: Family Medicine

## 2022-07-03 DIAGNOSIS — M5416 Radiculopathy, lumbar region: Secondary | ICD-10-CM

## 2022-07-08 NOTE — Progress Notes (Unsigned)
Referring Physician:  Denton Lank, FNP [address] [place],  [place] [address]  Primary Physician:  Marisue Ivan, MD  History of Present Illness: 07/09/2022 Tristan Thompson is here today with a chief complaint of low back pain that radiates into the right buttock, lateral leg and into the foot. He also complains of weakness in the right leg.  He has been having issues since 2014.  He had neck surgery at that time for cervical myelopathy and severe cervical stenosis.  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  He does have some recent symptoms of fall, difficulty with dexterity, and numbness in his hands.  More recently, he has been bothered by pain into his right leg.  He has pain with walking more than 50 feet.  He has pain into his right buttock.  Standing and sitting cause pain.   Bowel/Bladder Dysfunction: none  Conservative measures:  Physical therapy: has participated at [facility] 02/2021. Stopped due to cost, but continued with home exercises.  Multimodal medical therapy including regular antiinflammatories: baclofen, ibuprofen, tylenol, aspirin  Injections:  has received epidural steroid injections 05/23/2022: Right L5-S1 and right S1 transforaminal ESI (no relief, Celestone 12 mg)  04/12/2022: Right L5-S1 and right S1 transforaminal ESI (temporary relief, dexamethasone 10 mg, Dr. Mariah Milling)   Past Surgery: 07/16/13: C3-7 ACDF by Dr. Ardelia Mems has symptoms of cervical myelopathy.  The symptoms are causing a significant impact on the patient's life.   Review of Systems:  A 10 point review of systems is negative, except for the pertinent positives and negatives detailed in the HPI.  Past Medical History: Past Medical History:  Diagnosis Date   Anxiety    Chronic back pain    Hypertension    has a prescription but has not had it filled.   Umbilical hernia    has had surgery to "patch" it    Past Surgical  History: Past Surgical History:  Procedure Laterality Date   ANTERIOR CERVICAL DECOMP/DISCECTOMY FUSION N/A 07/16/2013   Procedure: ANTERIOR CERVICAL DECOMPRESSION/DISCECTOMY FUSION CERVICAL THREE-FOUR,FOUR-FIVE AND CERVICAL SIX-SEVEN;  Surgeon: Reinaldo Meeker, MD;  Location: [facility];  Service: Neurosurgery;  Laterality: N/A;   BACK SURGERY     ESOPHAGOGASTRODUODENOSCOPY N/A 01/04/2017   Procedure: ESOPHAGOGASTRODUODENOSCOPY (EGD);  Surgeon: Toney Reil, MD;  Location: [facility] ENDOSCOPY;  Service: Gastroenterology;  Laterality: N/A;   HERNIA REPAIR      Allergies: Allergies as of 07/09/2022   (No Known Allergies)    Medications: Current Meds  Medication Sig   aspirin EC 81 MG tablet Take 81 mg by mouth daily.   baclofen 5 MG TABS Take 5 mg by mouth 2 (two) times daily.   diazepam (VALIUM) 5 MG tablet Take 1 tablet (5 mg total) by mouth once for 1 dose.   escitalopram (LEXAPRO) 5 MG tablet Take 5 mg by mouth daily.   hydrochlorothiazide (MICROZIDE) 12.5 MG capsule Take 12.5 mg by mouth daily.   lovastatin (MEVACOR) 40 MG tablet Take 1 tablet by mouth at bedtime.   [DISCONTINUED] ibuprofen (ADVIL) 800 MG tablet Take 1 tablet (800 mg total) by mouth 3 (three) times daily.   [DISCONTINUED] ondansetron (ZOFRAN) 4 MG tablet Take 1 tablet (4 mg total) by mouth every 6 (six) hours.   [DISCONTINUED] promethazine-dextromethorphan (PROMETHAZINE-DM) 6.25-15 MG/5ML syrup Take 5 mLs by mouth 4 (four) times daily as needed.    Social History: Social History   Tobacco Use  Smoking status: Every Day    Packs/day: 0.50    Types: Cigarettes   Smokeless tobacco: Never  Vaping Use   Vaping Use: Never used  Substance Use Topics   Alcohol use: Yes    Alcohol/week: 4.0 standard drinks of alcohol    Types: 4 Cans of beer per week    Comment: six pack of beer/day   Drug use: No    Family Medical History: Family History  Problem Relation Age of Onset   Arthritis Mother     Hypertension Father     Physical Examination: Vitals:   07/09/22 0838  BP: (!) 150/82    General: Patient is well developed, well nourished, calm, collected, and in no apparent distress. Attention to examination is appropriate.  Neck:   Supple.  Full range of motion.  Respiratory: Patient is breathing without any difficulty.   NEUROLOGICAL:     Awake, alert, oriented to person, place, and time.  Speech is clear and fluent. Fund of knowledge is appropriate.   Cranial Nerves: Pupils equal round and reactive to light.  Facial tone is symmetric.  Facial sensation is symmetric. Shoulder shrug is symmetric. Tongue protrusion is midline.  There is no pronator drift.  ROM of spine: full.    Strength: Side Biceps Triceps Deltoid Interossei Grip Wrist Ext. Wrist Flex.  R 5 5 4+ 5 5 5 5   L 5 5 5 5 5 5 5    Side Iliopsoas Quads Hamstring PF DF EHL  R 5 5 5  4+ 4+ 4+  L 5 5 5 5 5 5    Reflexes are 3+ and symmetric at the biceps, triceps, brachioradialis, patella and achilles.   Hoffman's is present.   Bilateral upper and lower extremity sensation is intact to light touch.    No evidence of dysmetria noted.  Gait is wide-based.     Medical Decision Making  Imaging: MRI L spine 07/05/2022 IMPRESSION: 1. L2-L3 mild spinal canal stenosis and mild right neural foraminal narrowing. Narrowing of the lateral recesses at this level could affect the descending L3 nerve roots. 2. L3-L4 and L4-L5 mild bilateral neural foraminal narrowing. 3. L5-S1 mild right neural foraminal narrowing. Narrowing of the right-greater-than-left lateral recess could affect the descending S1 nerve roots.     Electronically Signed   By: Merilyn Baba M.D.   On: 07/05/2022 00:18  I have personally reviewed the images and agree with the above interpretation.  Assessment and Plan: [name] is a pleasant 56 y.o. 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  He also has  residual and current symptoms of cervical myelopathy.  Given his history, I would like to get a cervical spine MRI scan.  He has tried physical therapy but not within the last 12 months.  We will check and see whether he needs to go back to physical therapy to have fully tried conservative management.  Given the time course of his symptoms, I think he is a very poor candidate for physical therapy.  I think that right-sided L5-S1 laminoforaminotomy is appropriate for him.  I would not proceed until I know the status of the cervical spine.  I discussed the planned procedure at length with the patient, including the risks, benefits, alternatives, and indications. The risks discussed include but are not limited to bleeding, infection, need for reoperation, spinal fluid leak, stroke, vision loss, anesthetic complication, coma, paralysis, and even death. I also described in detail that improvement was not guaranteed.  The patient expressed understanding of these risks, and asked that we proceed with surgery. I described the surgery in layman's terms, and gave ample opportunity for questions, which were answered to the best of my ability.    I spent a total of 30 minutes in face-to-face and non-face-to-face activities related to this patient's care today.  Thank you for involving me in the care of this patient.      Fatima Fedie K. Myer Haff MD, [facility] Neurosurgery

## 2022-07-09 ENCOUNTER — Ambulatory Visit (INDEPENDENT_AMBULATORY_CARE_PROVIDER_SITE_OTHER): Payer: 59 | Admitting: Neurosurgery

## 2022-07-09 ENCOUNTER — Encounter: Payer: Self-pay | Admitting: Neurosurgery

## 2022-07-09 VITALS — BP 150/82 | Ht 74.0 in | Wt 219.0 lb

## 2022-07-09 DIAGNOSIS — M5416 Radiculopathy, lumbar region: Secondary | ICD-10-CM | POA: Diagnosis not present

## 2022-07-09 DIAGNOSIS — G959 Disease of spinal cord, unspecified: Secondary | ICD-10-CM | POA: Diagnosis not present

## 2022-07-09 MED ORDER — DIAZEPAM 5 MG PO TABS: 5.0000 mg | ORAL_TABLET | Freq: Once | ORAL | 0 refills | Status: AC

## 2022-07-16 ENCOUNTER — Telehealth: Payer: Self-pay

## 2022-07-16 DIAGNOSIS — M5416 Radiculopathy, lumbar region: Secondary | ICD-10-CM

## 2022-07-16 NOTE — Telephone Encounter (Signed)
-----   Message from Meade Maw, MD sent at 07/15/2022  1:17 PM EST ----- He will need to be referred back to PT for 6 weeks of PT  ----- Message ----- From: Rae Lips, CMA Sent: 07/15/2022  10:53 AM EST To: Meade Maw, MD  I looked up on the [facility] portal and since the patient hasn't had PT in the past year, spine surgery is not recommended. He last participated in PT in June/July 2022.

## 2022-07-16 NOTE — Telephone Encounter (Signed)
Please let the patient know that since he hasn't done PT for his back since June/July 2022, he will need to attempt this again before [facility] will approve any surgery on his back. Can you ask him where he would like to go?

## 2022-07-16 NOTE — Telephone Encounter (Signed)
He said that he will go to [facility].

## 2022-07-17 NOTE — Telephone Encounter (Signed)
Referral faxed to [facility] PT.

## 2022-07-17 NOTE — Telephone Encounter (Signed)
I have placed the order, please send to [facility]

## 2022-08-20 ENCOUNTER — Other Ambulatory Visit: Payer: Self-pay | Admitting: Neurosurgery

## 2022-08-20 DIAGNOSIS — G959 Disease of spinal cord, unspecified: Secondary | ICD-10-CM

## 2022-09-05 ENCOUNTER — Ambulatory Visit
Admission: RE | Admit: 2022-09-05 | Discharge: 2022-09-05 | Disposition: A | Discharge status: Home / Self Care | Payer: 59 | Source: Ambulatory Visit | Attending: Emergency Medicine | Admitting: Emergency Medicine

## 2022-09-05 VITALS — BP 150/91 | HR 63 | Temp 98.4°F | Resp 16 | Ht 74.0 in | Wt 215.0 lb

## 2022-09-05 DIAGNOSIS — J069 Acute upper respiratory infection, unspecified: Secondary | ICD-10-CM

## 2022-09-05 MED ORDER — IPRATROPIUM BROMIDE 0.03 % NA SOLN: 2.0000 | Freq: Two times a day (BID) | NASAL | 12 refills | Status: DC

## 2022-09-05 MED ORDER — AMOXICILLIN-POT CLAVULANATE 875-125 MG PO TABS: 1.0000 | ORAL_TABLET | Freq: Two times a day (BID) | ORAL | 0 refills | Status: DC

## 2022-09-05 NOTE — Discharge Instructions (Addendum)
Today you are being treated for a sinus infection which is most likely the result of your ear pain  Begin Augmentin every morning and every evening for 7 days, ideally you will see improvement in about 48 hours  Begin use of nasal spray every morning and every evening to further help clear the sinus passages    You can take Tylenol and/or Ibuprofen as needed for fever reduction and pain relief.   For cough: honey 1/2 to 1 teaspoon (you can dilute the honey in water or another fluid).  You can also use guaifenesin and dextromethorphan for cough. You can use a humidifier for chest congestion and cough.  If you don't have a humidifier, you can sit in the bathroom with the hot shower running.      For sore throat: try warm salt water gargles, cepacol lozenges, throat spray, warm tea or water with lemon/honey, popsicles or ice, or OTC cold relief medicine for throat discomfort.   For congestion: take a daily anti-histamine like Zyrtec, Claritin, and a oral decongestant, such as pseudoephedrine.  You can also use Flonase 1-2 sprays in each nostril daily.   It is important to stay hydrated: drink plenty of fluids (water, gatorade/powerade/pedialyte, juices, or teas) to keep your throat moisturized and help further relieve irritation/discomfort.

## 2022-09-05 NOTE — ED Provider Notes (Signed)
[facility]    CSN: [id number] Arrival date & time: 09/05/22  1201      History   Chief Complaint Chief Complaint  Patient presents with   Ear Pain   Nasal Congestion    HPI Tristan Thompson is a 56 y.o. male.   Patient presents for evaluation of right-sided ear pain, nasal congestion, rhinorrhea, cough present for 7 days.  Cough is intermittently productive, primarily in the morning upon awakening.  Unsure if the ear pain is radiating from teeth or vice versa.  Has attempted use of Tylenol which has been minimally effective.  Tolerating food and liquids.  No known sick contacts.  No pertinent medical history.  Denies fevers, chills, body aches, sore throat, shortness of breath or wheezing.   Past Medical History:  Diagnosis Date   Anxiety    Chronic back pain    Hypertension    has a prescription but has not had it filled.   Umbilical hernia    has had surgery to "patch" it    Patient Active Problem List   Diagnosis Date Noted   Alcohol dependence (HCC) 01/21/2017   Anxiety 01/21/2017   Chronic back pain 01/21/2017   Hypertension 01/21/2017   Seasonal allergies 01/21/2017    Past Surgical History:  Procedure Laterality Date   ANTERIOR CERVICAL DECOMP/DISCECTOMY FUSION N/A 07/16/2013   Procedure: ANTERIOR CERVICAL DECOMPRESSION/DISCECTOMY FUSION CERVICAL THREE-FOUR,FOUR-FIVE AND CERVICAL SIX-SEVEN;  Surgeon: Reinaldo Meeker, MD;  Location: [facility];  Service: Neurosurgery;  Laterality: N/A;   BACK SURGERY     ESOPHAGOGASTRODUODENOSCOPY N/A 01/04/2017   Procedure: ESOPHAGOGASTRODUODENOSCOPY (EGD);  Surgeon: Toney Reil, MD;  Location: [facility] ENDOSCOPY;  Service: Gastroenterology;  Laterality: N/A;   HERNIA REPAIR         Home Medications    Prior to Admission medications   Medication Sig Start Date End Date Taking? Authorizing Provider  escitalopram (LEXAPRO) 5 MG tablet Take 5 mg by mouth daily.   Yes [provider]   hydrochlorothiazide (MICROZIDE) 12.5 MG capsule Take 12.5 mg by mouth daily.   Yes [provider]  lovastatin (MEVACOR) 40 MG tablet Take 1 tablet by mouth at bedtime. 01/01/22 01/01/23 Yes [provider]  aspirin EC 81 MG tablet Take 81 mg by mouth daily.    [provider]  baclofen 5 MG TABS Take 5 mg by mouth 2 (two) times daily. 07/26/21   Valinda Hoar, NP    Family History Family History  Problem Relation Age of Onset   Arthritis Mother    Hypertension Father     Social History Social History   Tobacco Use   Smoking status: Every Day    Packs/day: 0.50    Types: Cigarettes   Smokeless tobacco: Never  Vaping Use   Vaping Use: Never used  Substance Use Topics   Alcohol use: Yes    Alcohol/week: 4.0 standard drinks of alcohol    Types: 4 Cans of beer per week    Comment: six pack of beer/day   Drug use: No     Allergies   Patient has no known allergies.   Review of Systems Review of Systems  Constitutional: Negative.   HENT:  Positive for congestion, ear pain and rhinorrhea. Negative for dental problem, drooling, ear discharge, facial swelling, hearing loss, mouth sores, nosebleeds, postnasal drip, sinus pressure, sinus pain, sneezing, sore throat, tinnitus, trouble swallowing and voice change.   Respiratory:  Positive for cough. Negative for apnea, choking,  chest tightness, shortness of breath, wheezing and stridor.   Cardiovascular: Negative.   Gastrointestinal: Negative.      Physical Exam Triage Vital Signs ED Triage Vitals  Enc Vitals Group     BP 09/05/22 1212 (!) 150/91     Pulse Rate 09/05/22 1212 63     Resp 09/05/22 1212 16     Temp 09/05/22 1212 98.4 F (36.9 C)     Temp Source 09/05/22 1212 Oral     SpO2 09/05/22 1212 99 %     Weight 09/05/22 1214 215 lb (97.5 kg)     Height 09/05/22 1214 6\' 2"  (1.88 m)     Head Circumference --      Peak Flow --      Pain Score 09/05/22 1211 1     Pain Loc --      Pain  Edu? --      Excl. in GC? --    No data found.  Updated Vital Signs BP (!) 150/91 (BP Location: Left Arm)   Pulse 63   Temp 98.4 F (36.9 C) (Oral)   Resp 16   Ht 6\' 2"  (1.88 m)   Wt 215 lb (97.5 kg)   SpO2 99%   BMI 27.60 kg/m   Visual Acuity Right Eye Distance:   Left Eye Distance:   Bilateral Distance:    Right Eye Near:   Left Eye Near:    Bilateral Near:     Physical Exam Constitutional:      Appearance: Normal appearance.  HENT:     Right Ear: Tympanic membrane, ear canal and external ear normal.     Left Ear: Tympanic membrane, ear canal and external ear normal.     Nose: Congestion and rhinorrhea present.     Mouth/Throat:     Mouth: Mucous membranes are moist.     Pharynx: Posterior oropharyngeal erythema present.  Cardiovascular:     Rate and Rhythm: Normal rate and regular rhythm.     Pulses: Normal pulses.     Heart sounds: Normal heart sounds.  Pulmonary:     Effort: Pulmonary effort is normal.     Breath sounds: Normal breath sounds.  Skin:    General: Skin is warm and dry.  Neurological:     Mental Status: He is alert and oriented to person, place, and time. Mental status is at baseline.  Psychiatric:        Mood and Affect: Mood normal.        Behavior: Behavior normal.    UC Treatments / Results  Labs (all labs ordered are listed, but only abnormal results are displayed) Labs Reviewed - No data to display  EKG   Radiology No results found.  Procedures Procedures (including critical care time)  Medications Ordered in UC Medications - No data to display  Initial Impression / Assessment and Plan / UC Course  I have reviewed the triage vital signs and the nursing notes.  Pertinent labs & imaging results that were available during my care of the patient were reviewed by me and considered in my medical decision making (see chart for details).  Acute upper respiratory infection  Patient is in no signs of distress nor toxic  appearing.  Vital signs are stable.  Low suspicion for pneumonia, pneumothorax or bronchitis and therefore will defer imaging.  Due to timeline of illness viral testing deferred, will prophylactically provide coverage for bacteria, Augmentin prescribed as well as ipratropium for management of sinus congestion.  No abnormalities on exam to the bilateral ears nor no signs of infection to the teeth, most likely result of congestion.May use additional over-the-counter medications as needed for supportive care.  May follow-up with urgent care as needed if symptoms persist or worsen.   Final Clinical Impressions(s) / UC Diagnoses   Final diagnoses:  None   Discharge Instructions   None    ED Prescriptions   None    PDMP not reviewed this encounter.   Valinda Hoar, NP 09/05/22 1239

## 2022-09-05 NOTE — ED Triage Notes (Signed)
Chief Complaint: pain in the right ear. Congestion in the nose and chest. Slight dry cough that is productive in the morning. Slight runny nose. No fever, chills, or body aches.   Onset: 1 week   Prescriptions or OTC medications tried: Yes- Nyquil, Dayquil     with mild relief  Sick exposure: No  New foods, medications, or products: No  Recent Travel: No

## 2022-10-23 ENCOUNTER — Ambulatory Visit
Admission: EM | Admit: 2022-10-23 | Discharge: 2022-10-23 | Disposition: A | Discharge status: Home / Self Care | Payer: 59 | Attending: Family Medicine | Admitting: Family Medicine

## 2022-10-23 ENCOUNTER — Ambulatory Visit (INDEPENDENT_AMBULATORY_CARE_PROVIDER_SITE_OTHER): Payer: 59

## 2022-10-23 DIAGNOSIS — J40 Bronchitis, not specified as acute or chronic: Secondary | ICD-10-CM | POA: Diagnosis present

## 2022-10-23 DIAGNOSIS — R197 Diarrhea, unspecified: Secondary | ICD-10-CM | POA: Diagnosis not present

## 2022-10-23 DIAGNOSIS — I10 Essential (primary) hypertension: Secondary | ICD-10-CM | POA: Insufficient documentation

## 2022-10-23 DIAGNOSIS — R059 Cough, unspecified: Secondary | ICD-10-CM

## 2022-10-23 DIAGNOSIS — R1110 Vomiting, unspecified: Secondary | ICD-10-CM | POA: Insufficient documentation

## 2022-10-23 DIAGNOSIS — Z1152 Encounter for screening for COVID-19: Secondary | ICD-10-CM | POA: Diagnosis not present

## 2022-10-23 DIAGNOSIS — R0989 Other specified symptoms and signs involving the circulatory and respiratory systems: Secondary | ICD-10-CM | POA: Diagnosis not present

## 2022-10-23 DIAGNOSIS — F17200 Nicotine dependence, unspecified, uncomplicated: Secondary | ICD-10-CM | POA: Insufficient documentation

## 2022-10-23 LAB — RAPID INFLUENZA A&B ANTIGENS
Influenza A (ARMC): NEGATIVE
Influenza B (ARMC): NEGATIVE

## 2022-10-23 LAB — SARS CORONAVIRUS 2 BY RT PCR: SARS Coronavirus 2 by RT PCR: NEGATIVE

## 2022-10-23 MED ORDER — AMOXICILLIN-POT CLAVULANATE 875-125 MG PO TABS: 1.0000 | ORAL_TABLET | Freq: Two times a day (BID) | ORAL | 0 refills | Status: DC

## 2022-10-23 MED ORDER — PREDNISONE 20 MG PO TABS: 40.0000 mg | ORAL_TABLET | Freq: Every day | ORAL | 0 refills | Status: AC

## 2022-10-23 MED ORDER — PROMETHAZINE-DM 6.25-15 MG/5ML PO SYRP: 5.0000 mL | ORAL_SOLUTION | Freq: Four times a day (QID) | ORAL | 0 refills | Status: DC | PRN

## 2022-10-23 NOTE — ED Provider Notes (Addendum)
[facility]    CSN: [id number] Arrival date & time: 10/23/22  0910      History   Chief Complaint Chief Complaint  Patient presents with   Cough   Emesis   Diarrhea   Congestion    HPI Tristan Thompson is a 57 y.o. male.   HPI   Tristan Thompson presents for stuffy nose, vomiting, diarrhea, cough for the past 3 days. Wakes up with a bad taste in his mouth. He has been sleeping more than normal. Has congestion in his lungs. Has scratchy feeling in his chest. Has been using nasal spray. Took some Tylenol Cold and Flu. His partner got a chest infection.  He smokes.       Past Medical History:  Diagnosis Date   Anxiety    Chronic back pain    Hypertension    has a prescription but has not had it filled.   Umbilical hernia    has had surgery to "patch" it    Patient Active Problem List   Diagnosis Date Noted   Alcohol dependence ([place]) 01/21/2017   Anxiety 01/21/2017   Chronic back pain 01/21/2017   Hypertension 01/21/2017   Seasonal allergies 01/21/2017    Past Surgical History:  Procedure Laterality Date   ANTERIOR CERVICAL DECOMP/DISCECTOMY FUSION N/A 07/16/2013   Procedure: ANTERIOR CERVICAL DECOMPRESSION/DISCECTOMY FUSION CERVICAL THREE-FOUR,FOUR-FIVE AND CERVICAL SIX-SEVEN;  Surgeon: Faythe Ghee, MD;  Location: [facility];  Service: Neurosurgery;  Laterality: N/A;   BACK SURGERY     ESOPHAGOGASTRODUODENOSCOPY N/A 01/04/2017   Procedure: ESOPHAGOGASTRODUODENOSCOPY (EGD);  Surgeon: Lin Landsman, MD;  Location: [facility] ENDOSCOPY;  Service: Gastroenterology;  Laterality: N/A;   HERNIA REPAIR         Home Medications    Prior to Admission medications   Medication Sig Start Date End Date Taking? Authorizing Provider  aspirin EC 81 MG tablet Take 81 mg by mouth daily.   Yes [provider]  baclofen 5 MG TABS Take 5 mg by mouth 2 (two) times daily. 07/26/21  Yes White, Adrienne R, NP  escitalopram (LEXAPRO) 5 MG tablet Take 5 mg by mouth  daily.   Yes [provider]  hydrochlorothiazide (MICROZIDE) 12.5 MG capsule Take 12.5 mg by mouth daily.   Yes [provider]  ipratropium (ATROVENT) 0.03 % nasal spray Place 2 sprays into both nostrils every 12 (twelve) hours. 09/05/22  Yes White, Adrienne R, NP  lovastatin (MEVACOR) 40 MG tablet Take 1 tablet by mouth at bedtime. 01/01/22 01/01/23 Yes [provider]  predniSONE (DELTASONE) 20 MG tablet Take 2 tablets (40 mg total) by mouth daily for 5 days. 10/23/22 10/28/22 Yes Carman Auxier, DO  promethazine-dextromethorphan (PROMETHAZINE-DM) 6.25-15 MG/5ML syrup Take 5 mLs by mouth 4 (four) times daily as needed. 10/23/22  Yes Latessa Tillis, DO  amoxicillin-clavulanate (AUGMENTIN) 875-125 MG tablet Take 1 tablet by mouth every 12 (twelve) hours. 10/23/22   Lyndee Hensen, DO    Family History Family History  Problem Relation Age of Onset   Arthritis Mother    Hypertension Father     Social History Social History   Tobacco Use   Smoking status: Every Day    Packs/day: 0.50    Types: Cigarettes   Smokeless tobacco: Never  Vaping Use   Vaping Use: Never used  Substance Use Topics   Alcohol use: Yes    Alcohol/week: 4.0 standard drinks of alcohol    Types: 4 Cans of beer per week  Comment: six pack of beer/day   Drug use: No     Allergies   Patient has no known allergies.   Review of Systems Review of Systems: negative unless otherwise stated in HPI.      Physical Exam Triage Vital Signs ED Triage Vitals  Enc Vitals Group     BP 10/23/22 0934 (!) 161/82     Pulse Rate 10/23/22 0934 70     Resp 10/23/22 0934 16     Temp 10/23/22 0934 98.6 F (37 C)     Temp Source 10/23/22 0934 Oral     SpO2 10/23/22 0934 100 %     Weight 10/23/22 0933 215 lb (97.5 kg)     Height 10/23/22 0933 6' 2"$  (1.88 m)     Head Circumference --      Peak Flow --      Pain Score 10/23/22 0932 0     Pain Loc --      Pain Edu? --      Excl. in [place]? --     No data found.  Updated Vital Signs BP (!) 149/88 (BP Location: Right Arm)   Pulse 70   Temp 98.6 F (37 C) (Oral)   Resp 16   Ht 6' 2"$  (1.88 m)   Wt 97.5 kg   SpO2 100%   BMI 27.60 kg/m   Visual Acuity Right Eye Distance:   Left Eye Distance:   Bilateral Distance:    Right Eye Near:   Left Eye Near:    Bilateral Near:     Physical Exam GEN:     alert, ill appearing male and no distress    HENT:  mucus membranes moist, oropharyngeal without lesions or erythema, erythematous boggy turbinates, clear nasal discharge  EYES:   pupils equal and reactive, EOM intact NECK:  supple, normal ROM RESP:  clear to auscultation bilaterally, no increased work of breathing  CVS:   regular rate and rhythm, no murmur, distal pulses intact   NEURO: speech normal, alert and oriented   Skin:   warm and dry Psych: Normal affect, appropriate speech and behavior      UC Treatments / Results  Labs (all labs ordered are listed, but only abnormal results are displayed) Labs Reviewed  SARS CORONAVIRUS 2 BY RT PCR  RAPID INFLUENZA A&B ANTIGENS    EKG   Radiology DG Chest 2 View  Result Date: 10/23/2022 CLINICAL DATA:  Cough and congestion. EXAM: CHEST - 2 VIEW COMPARISON:  06/29/2020 FINDINGS: The cardiac silhouette, mediastinal and hilar contours are normal. The lungs are clear of an acute process. No infiltrates, edema or effusions. No pulmonary lesions. Bilateral nipple shadows again noted. The bony thorax is intact. IMPRESSION: No acute cardiopulmonary findings. Electronically Signed   By: Marijo Sanes M.D.   On: 10/23/2022 10:53    Procedures Procedures (including critical care time)  Medications Ordered in UC Medications - No data to display  Initial Impression / Assessment and Plan / UC Course  I have reviewed the triage vital signs and the nursing notes.  Pertinent labs & imaging results that were available during my care of the patient were reviewed by me and considered  in my medical decision making (see chart for details).       Patient is a 57 y.o. male  who smokes presents for 3 days of flu-like symptoms with cough for the past month.  Overall patient is ill but nontoxic-appearing and afebrile.  Vital signs stable. Satting  well on room air.  Pulmonary exam is unremarkable.  CXR, influenza and COVID testing obtained.   Chest xray personally reviewed by me without focal pneumonia, pleural effusion, cardiomegaly or pneumothorax. COVID and influenza were negative.  Treat acute bronchitis with steroids and antibiotics as below.  Promethazine DM cough syrup given for cough and allow patient to rest.  Typical duration of symptoms discussed. Return and ED precautions given and patient voiced understanding. Discussed MDM, treatment plan and plan for follow-up with patient who agrees with plan.    Final Clinical Impressions(s) / UC Diagnoses   Final diagnoses:  Bronchitis     Discharge Instructions      Your COVID and strep tests are negative. Your chest xray did not show a pneumonia.  I sent some steroids and antibiotics with a cough syrup to your pharmacy.  Stop by and pick them up.       ED Prescriptions     Medication Sig Dispense Auth. Provider   amoxicillin-clavulanate (AUGMENTIN) 875-125 MG tablet Take 1 tablet by mouth every 12 (twelve) hours. 14 tablet Lakin Rhine, DO   predniSONE (DELTASONE) 20 MG tablet Take 2 tablets (40 mg total) by mouth daily for 5 days. 10 tablet Shantil Vallejo, DO   promethazine-dextromethorphan (PROMETHAZINE-DM) 6.25-15 MG/5ML syrup Take 5 mLs by mouth 4 (four) times daily as needed. 118 mL Lyndee Hensen, DO      PDMP not reviewed this encounter.    Lyndee Hensen, DO 10/23/22 1125

## 2022-10-23 NOTE — Discharge Instructions (Addendum)
Your COVID and strep tests are negative. Your chest xray did not show a pneumonia.  I sent some steroids and antibiotics with a cough syrup to your pharmacy.  Stop by and pick them up.

## 2022-10-23 NOTE — ED Triage Notes (Signed)
Pt c/o cough,congestion,emesis,chills,bodyaches & diarrhea x3 days. Had neg covid test at home yesterday.

## 2022-12-24 ENCOUNTER — Ambulatory Visit
Admission: EM | Admit: 2022-12-24 | Discharge: 2022-12-24 | Disposition: A | Discharge status: Home / Self Care | Payer: 59 | Attending: Internal Medicine | Admitting: Internal Medicine

## 2022-12-24 ENCOUNTER — Encounter: Payer: Self-pay | Admitting: Internal Medicine

## 2022-12-24 ENCOUNTER — Ambulatory Visit (INDEPENDENT_AMBULATORY_CARE_PROVIDER_SITE_OTHER): Payer: 59

## 2022-12-24 ENCOUNTER — Other Ambulatory Visit: Payer: Self-pay

## 2022-12-24 DIAGNOSIS — R059 Cough, unspecified: Secondary | ICD-10-CM

## 2022-12-24 DIAGNOSIS — J209 Acute bronchitis, unspecified: Secondary | ICD-10-CM

## 2022-12-24 DIAGNOSIS — R0789 Other chest pain: Secondary | ICD-10-CM | POA: Diagnosis not present

## 2022-12-24 DIAGNOSIS — Z8719 Personal history of other diseases of the digestive system: Secondary | ICD-10-CM

## 2022-12-24 DIAGNOSIS — R079 Chest pain, unspecified: Secondary | ICD-10-CM | POA: Diagnosis not present

## 2022-12-24 MED ORDER — OMEPRAZOLE 20 MG PO CPDR: 20.0000 mg | DELAYED_RELEASE_CAPSULE | Freq: Every day | ORAL | 0 refills | Status: DC

## 2022-12-24 MED ORDER — DOXYCYCLINE HYCLATE 100 MG PO CAPS: 100.0000 mg | ORAL_CAPSULE | Freq: Two times a day (BID) | ORAL | 0 refills | Status: DC

## 2022-12-24 MED ORDER — LIDOCAINE VISCOUS HCL 2 % MT SOLN
15.0000 mL | Freq: Once | OROMUCOSAL | Status: AC
  Administered 2022-12-24: 15 mL via OROMUCOSAL

## 2022-12-24 MED ORDER — ALUM & MAG HYDROXIDE-SIMETH 200-200-20 MG/5ML PO SUSP
30.0000 mL | Freq: Once | ORAL | Status: AC
  Administered 2022-12-24: 30 mL via ORAL

## 2022-12-24 MED ORDER — ALBUTEROL SULFATE HFA 108 (90 BASE) MCG/ACT IN AERS: 2.0000 | INHALATION_SPRAY | RESPIRATORY_TRACT | 0 refills | Status: DC | PRN

## 2022-12-24 NOTE — ED Provider Notes (Signed)
[facility]    CSN: [id number] Arrival date & time: 12/24/22  0801      History   Chief Complaint Chief Complaint  Patient presents with   Chest Pain    HPI Tristan Thompson is a 57 y.o. male who presents with onset of cough which is productive in the am with green mucous x 3 days. Yesterday noticed L side chest pain. Had sweats and HA 2 days ago. Has not had a fever. Admits he is still a smoker. Nothings makes the CP worse of better. Notices it at times with deep breaths, but not every time he breaths in. The CP is intermittent. Had R ear pain a few days ago. Has chronic fatigue since he had covid 1 y ago. Has hx of esophageal stricture diagnosed by endoscopy 2018 and has not had a FU since. He was supposed to see GI last month, but due to his wife having GI bleed he missed it. Does have trouble at time passing meat or other food, but as long as he chews it well, he can swallow it. He admits he drinks 6 beers per day    Past Medical History:  Diagnosis Date   Anxiety    Chronic back pain    Hypertension    has a prescription but has not had it filled.   Umbilical hernia    has had surgery to "patch" it    Patient Active Problem List   Diagnosis Date Noted   Alcohol dependence 01/21/2017   Anxiety 01/21/2017   Chronic back pain 01/21/2017   Hypertension 01/21/2017   Seasonal allergies 01/21/2017    Past Surgical History:  Procedure Laterality Date   ANTERIOR CERVICAL DECOMP/DISCECTOMY FUSION N/A 07/16/2013   Procedure: ANTERIOR CERVICAL DECOMPRESSION/DISCECTOMY FUSION CERVICAL THREE-FOUR,FOUR-FIVE AND CERVICAL SIX-SEVEN;  Surgeon: Reinaldo Meeker, MD;  Location: [facility];  Service: Neurosurgery;  Laterality: N/A;   BACK SURGERY     ESOPHAGOGASTRODUODENOSCOPY N/A 01/04/2017   Procedure: ESOPHAGOGASTRODUODENOSCOPY (EGD);  Surgeon: Toney Reil, MD;  Location: [facility]COPY;  Service: Gastroenterology;  Laterality: N/A;   HERNIA REPAIR          Home Medications    Prior to Admission medications   Medication Sig Start Date End Date Taking? Authorizing Provider  albuterol (VENTOLIN HFA) 108 (90 Base) MCG/ACT inhaler Inhale 2 puffs into the lungs every 4 (four) hours as needed for wheezing or shortness of breath. 12/24/22  Yes Rodriguez-Southworth, Nettie Elm, PA-C  doxycycline (VIBRAMYCIN) 100 MG capsule Take 1 capsule (100 mg total) by mouth 2 (two) times daily. 12/24/22  Yes Rodriguez-Southworth, Nettie Elm, PA-C  omeprazole (PRILOSEC) 20 MG capsule Take 1 capsule (20 mg total) by mouth daily. 12/24/22  Yes Rodriguez-Southworth, Viviana Simpler  aspirin EC 81 MG tablet Take 81 mg by mouth daily.    [provider]  escitalopram (LEXAPRO) 5 MG tablet Take 5 mg by mouth daily.    [provider]  hydrochlorothiazide (MICROZIDE) 12.5 MG capsule Take 12.5 mg by mouth daily.    [provider]  ipratropium (ATROVENT) 0.03 % nasal spray Place 2 sprays into both nostrils every 12 (twelve) hours. 09/05/22   White, Elita Boone, NP  lovastatin (MEVACOR) 40 MG tablet Take 1 tablet by mouth at bedtime. 01/01/22 01/01/23  [provider]    Family History Family History  Problem Relation Age of Onset   Arthritis Mother    Hypertension Father     Social History Social History   Tobacco  Use   Smoking status: Every Day    Packs/day: .5    Types: Cigarettes   Smokeless tobacco: Never  Vaping Use   Vaping Use: Never used  Substance Use Topics   Alcohol use: Yes    Alcohol/week: 6.0 standard drinks of alcohol    Types: 6 Cans of beer per week    Comment: six pack of beer/day   Drug use: No     Allergies   Patient has no known allergies.   Review of Systems Review of Systems As noted in HPI  Physical Exam Triage Vital Signs ED Triage Vitals  Enc Vitals Group     BP 12/24/22 0813 122/84     Pulse Rate 12/24/22 0813 71     Resp 12/24/22 0813 20     Temp 12/24/22 0813 98.6 F (37 C)     Temp  Source 12/24/22 0813 Oral     SpO2 12/24/22 0813 97 %     Weight --      Height --      Head Circumference --      Peak Flow --      Pain Score 12/24/22 0814 1     Pain Loc --      Pain Edu? --      Excl. in GC? --    No data found.  Updated Vital Signs BP 122/84   Pulse 71   Temp 98.6 F (37 C) (Oral)   Resp 20   SpO2 97%   Visual Acuity Right Eye Distance:   Left Eye Distance:   Bilateral Distance:    Right Eye Near:   Left Eye Near:    Bilateral Near:     Physical Exam Physical Exam Constitutional:      General: He is not in acute distress.    Appearance: He is not toxic-appearing.  HENT:     Head: Normocephalic.     Right Ear: Tympanic membrane, ear canal and external ear normal.     Left Ear: Ear canal and external ear normal.     Nose: Nose normal.     Mouth/Throat: clear    Mouth: Mucous membranes are moist.     Pharynx: Oropharynx is clear.  Eyes:     General: No scleral icterus.    Conjunctiva/sclera: Conjunctivae normal.  Cardiovascular:     Rate and Rhythm: Normal rate and regular rhythm.     Heart sounds: No murmur heard.   Pulmonary:     Effort: Pulmonary effort is normal. No respiratory distress.     Breath sounds: clear. I was not able to reproduce his pain with chest palpation    Musculoskeletal:        General: Normal range of motion.     Cervical back: Neck supple.  Lymphadenopathy:     Cervical: No cervical adenopathy.  Skin:    General: Skin is warm and dry.     Findings: No rash.  Neurological:     Mental Status: He is alert and oriented to person, place, and time.     Gait: Gait normal.  Psychiatric:        Mood and Affect: Mood normal.        Behavior: Behavior normal.        Thought Content: Thought content normal.        Judgment: Judgment normal.    UC Treatments / Results  Labs (all labs ordered are listed, but only abnormal results are displayed) Labs Reviewed -  No data to display  EKG NSR, when Compared with ERG  from 2014 seems unchanged  Radiology DG Chest 2 View  Result Date: 12/24/2022 CLINICAL DATA:  Cough and chest pain, initial encounter EXAM: CHEST - 2 VIEW COMPARISON:  10/23/2022 FINDINGS: Cardiac shadow is within normal limits. Bilateral nipple shadows are again seen. Lungs are well aerated bilaterally. No focal infiltrate or effusion is seen. No bony abnormality is noted. Postsurgical changes in the cervical spine are noted. IMPRESSION: No acute abnormality noted. Electronically Signed   By: Alcide Clever M.D.   On: 12/24/2022 09:18    Procedures Procedures (including critical care time)  Medications Ordered in UC Medications  lidocaine (XYLOCAINE) 2 % viscous mouth solution 15 mL (15 mLs Mouth/Throat Given 12/24/22 0927)  alum & mag hydroxide-simeth (MAALOX/MYLANTA) 200-200-20 MG/5ML suspension 30 mL (30 mLs Oral Given 12/24/22 1610)    Initial Impression / Assessment and Plan / UC Course  I have reviewed the triage vital signs and the nursing notes. He was given GI cocktail and he can't tell it helped, since the discomfort was not bad when he came in.  Pertinent imaging results that were available during my care of the patient were reviewed by me and considered in my medical decision making (see chart for details).  Atypical chest pain which I believe may be esophageal etiology Bronchitis   I placed him on Doxy  and Albuterol inhaler to cover bronchitis I placed him back on Prilosec as noted until he sees GI Advised to d/c drinking so much and needs to reschedule his GI appointment.  See instructions.   Final Clinical Impressions(s) / UC Diagnoses   Final diagnoses:  Atypical chest pain  Acute bronchitis, unspecified organism  History of esophageal stricture     Discharge Instructions      Follow up with your GI doctor     ED Prescriptions     Medication Sig Dispense Auth. Provider   doxycycline (VIBRAMYCIN) 100 MG capsule Take 1 capsule (100 mg total) by mouth 2  (two) times daily. 20 capsule Rodriguez-Southworth, Nettie Elm, PA-C   omeprazole (PRILOSEC) 20 MG capsule Take 1 capsule (20 mg total) by mouth daily. 30 capsule Rodriguez-Southworth, Nettie Elm, PA-C   albuterol (VENTOLIN HFA) 108 (90 Base) MCG/ACT inhaler Inhale 2 puffs into the lungs every 4 (four) hours as needed for wheezing or shortness of breath. 18 g Rodriguez-Southworth, Nettie Elm, PA-C      PDMP not reviewed this encounter.   Garey Ham, [place] 12/24/22 1127

## 2022-12-24 NOTE — ED Triage Notes (Signed)
Started with headache 3 days ago and yesterday started having left sided chest pain and cough for a "couple of months. Pt states he has been feeling drained and tired since he was dx with COVID 1 year ago

## 2022-12-24 NOTE — Discharge Instructions (Addendum)
Follow up with your GI doctor

## 2022-12-31 ENCOUNTER — Other Ambulatory Visit: Payer: Self-pay | Admitting: Family Medicine

## 2022-12-31 DIAGNOSIS — R1310 Dysphagia, unspecified: Secondary | ICD-10-CM

## 2023-01-16 ENCOUNTER — Ambulatory Visit
Admission: RE | Admit: 2023-01-16 | Discharge: 2023-01-16 | Disposition: A | Discharge status: Home / Self Care | Payer: 59 | Source: Ambulatory Visit | Attending: Family Medicine | Admitting: Family Medicine

## 2023-01-16 DIAGNOSIS — R1310 Dysphagia, unspecified: Secondary | ICD-10-CM | POA: Diagnosis present

## 2023-01-16 NOTE — Progress Notes (Addendum)
Modified Barium Swallow Study  Patient Details  Name: Tristan Thompson MRN: [id number] Date of Birth: [birth year]  Today's Date: 01/16/2023  Modified Barium Swallow completed.  Full report located under Chart Review in the Imaging Section.  History of Present Illness Pt is a 57 yo w/ PMH of back pain, anxiety, HTN, ETOH abuse, hernia, and weakness in the right leg w/ issues since 2014. He had neck surgery at that time for cervical myelopathy and severe cervical stenosis. 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  He does have some recent symptoms of fall, difficulty with dexterity, and numbness in his hands.   2 CXRs in 2024(feb, april) revealed: No acute abnormality noted each time.   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  He points to his mod-sternum area where foods "get stuck".  He endorsed having had 2 Esophageal Dilations "when i have come to the ED b/c food was stuck there".  He has not followed up w/ a GI.    Clinical Impression Patient presents with functional oropharyngeal swallowing. No laryngeal penetration nor aspiration of food or liquid material occurred during study. Noted presence of cervical hardware. OF NOTE: pt's follow through during swallowing/instructions was hampered slightly by his terrible distaste/reaction to the Barium product(gagging occurred x2). He was able to complete the study w/ gentle encouragement.   Oral stage is characterized by adequate lip closure, bolus preparation and containment, and anterior to posterior transit. Swallow initiation occurs at the level of the BOT-valleculae. No oral residue post swallowing.  Pharyngeal stage is noted for adequate tongue base retraction, adequate hyolaryngeal excursion, and adequate pharyngeal constriction. Epiglottic deflection is complete; there is No penetration or aspiration. There is No pharynngeal residue post swallow. Pharyngeal stripping wave is complete.   Amplitude/duration of cricopharyngeus opening is WFL. There is adequate/complete clearance through the cervical esophagus. OF NOTE: min prominence of tissue noted along posterior cervical wall adjacent to the cervical hardware during more effortful swallowing(increased textured trials). This did not impede bolus clearing of this area. Barium tablet was not given d/t pt having no c/o swallowing difficulty w/ pills. Pt did not report a globus sensation during trials. Consistencies tested were thin liquids x2 tsps, 2 cup sip, 3 sequential sips, nectar x1 tsp, 1 cup sip, 2 sequential cup sips, honey x1 tsp, pudding x1 tsp, regular solid (1/2 graham cracker with pudding).  Recommended continue w/ regular diet with thin liquids; educated pt verbally re: strategies including moistening dry meats, alternating solids and liquids, small bites. No further ST indicated. Factors that may increase risk of adverse event in presence of aspiration Rubye Oaks [employer] Clearance Coots 2021):  (n/a)    Swallow Evaluation Recommendations Recommendations: PO diet PO Diet Recommendation: Regular;Thin liquids (Level 0) (foods cut small, moistened) Liquid Administration via: Cup Medication Administration: Whole meds with liquid (or w/ a Puree if easier for clearing Esophagus) Supervision: Patient able to self-feed Swallowing strategies  : Minimize environmental distractions;Slow rate;Small bites/sips;Follow solids with liquids Postural changes: Position pt fully upright for meals;Stay upright 30-60 min after meals (REFLUX precs.) Oral care recommendations: Oral care BID (2x/day);Pt independent with oral care Recommended consults: Consider GI consultation;Consider esophageal assessment d/t his complaints of Esophageal phase Dysmotility and discomfort (h/o 2 Dilations, per pt)        Jerilynn Som, MS, CCC-SLP Speech Language Pathologist Rehab Services; [facility] [employer] [phone] (ascom) Wilson Dusenbery 01/16/2023,6:19  PM

## 2023-02-11 ENCOUNTER — Encounter: Payer: Self-pay | Admitting: Neurosurgery

## 2023-02-11 ENCOUNTER — Ambulatory Visit (INDEPENDENT_AMBULATORY_CARE_PROVIDER_SITE_OTHER): Payer: 59 | Admitting: Neurosurgery

## 2023-02-11 VITALS — BP 156/86 | Ht 74.0 in | Wt 220.6 lb

## 2023-02-11 DIAGNOSIS — M25551 Pain in right hip: Secondary | ICD-10-CM | POA: Diagnosis not present

## 2023-02-11 DIAGNOSIS — M5416 Radiculopathy, lumbar region: Secondary | ICD-10-CM

## 2023-02-11 DIAGNOSIS — G959 Disease of spinal cord, unspecified: Secondary | ICD-10-CM

## 2023-02-11 MED ORDER — METHOCARBAMOL 500 MG PO TABS: 500.0000 mg | ORAL_TABLET | Freq: Four times a day (QID) | ORAL | 0 refills | Status: DC | PRN

## 2023-02-11 NOTE — Progress Notes (Signed)
Referring Physician:  Marisue Ivan, MD [phone] [facility] [address] [facility] [place],  [place] [address]  Primary Physician:  Marisue Ivan, MD  History of Present Illness: 02/11/2023 Tristan Thompson continues to have symptoms as noted below.  Due to unforeseen circumstances, he was unable to pursue physical therapy or get his MRI scan of the cervical spine.  07/09/2022 Tristan Thompson is here today with a chief complaint of low back pain that radiates into the right buttock, lateral leg and into the foot. He also complains of weakness in the right leg.  He has been having issues since 2014.  He had neck surgery at that time for cervical myelopathy and severe cervical stenosis.  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  He does have some recent symptoms of fall, difficulty with dexterity, and numbness in his hands.  More recently, he has been bothered by pain into his right leg.  He has pain with walking more than 50 feet.  He has pain into his right buttock.  Standing and sitting cause pain.   Bowel/Bladder Dysfunction: none  Conservative measures:  Physical therapy: has participated at [facility] 02/2021. Stopped due to cost, but continued with home exercises.  Multimodal medical therapy including regular antiinflammatories: baclofen, ibuprofen, tylenol, aspirin  Injections:  has received epidural steroid injections 05/23/2022: Right L5-S1 and right S1 transforaminal ESI (no relief, Celestone 12 mg)  04/12/2022: Right L5-S1 and right S1 transforaminal ESI (temporary relief, dexamethasone 10 mg, Dr. Mariah Milling)   Past Surgery: 07/16/13: C3-7 ACDF by Dr. Ardelia Mems has symptoms of cervical myelopathy.  The symptoms are causing a significant impact on the patient's life.   Review of Systems:  A 10 point review of systems is negative, except for the pertinent positives and negatives detailed in the HPI.  Past Medical History: Past Medical  History:  Diagnosis Date   Anxiety    Chronic back pain    Hypertension    has a prescription but has not had it filled.   Umbilical hernia    has had surgery to "patch" it    Past Surgical History: Past Surgical History:  Procedure Laterality Date   ANTERIOR CERVICAL DECOMP/DISCECTOMY FUSION N/A 07/16/2013   Procedure: ANTERIOR CERVICAL DECOMPRESSION/DISCECTOMY FUSION CERVICAL THREE-FOUR,FOUR-FIVE AND CERVICAL SIX-SEVEN;  Surgeon: Reinaldo Meeker, MD;  Location: [facility];  Service: Neurosurgery;  Laterality: N/A;   BACK SURGERY     ESOPHAGOGASTRODUODENOSCOPY N/A 01/04/2017   Procedure: ESOPHAGOGASTRODUODENOSCOPY (EGD);  Surgeon: Toney Reil, MD;  Location: [facility] ENDOSCOPY;  Service: Gastroenterology;  Laterality: N/A;   HERNIA REPAIR      Allergies: Allergies as of 02/11/2023   (No Known Allergies)    Medications: Current Meds  Medication Sig   albuterol (VENTOLIN HFA) 108 (90 Base) MCG/ACT inhaler Inhale 2 puffs into the lungs every 4 (four) hours as needed for wheezing or shortness of breath.   escitalopram (LEXAPRO) 5 MG tablet Take 5 mg by mouth daily.   gabapentin (NEURONTIN) 300 MG capsule Take 300 mg by mouth daily.   hydrochlorothiazide (MICROZIDE) 12.5 MG capsule Take 12.5 mg by mouth daily.   ipratropium (ATROVENT) 0.03 % nasal spray Place 2 sprays into both nostrils every 12 (twelve) hours.   lovastatin (MEVACOR) 40 MG tablet Take 1 tablet by mouth at bedtime.   omeprazole (PRILOSEC) 20 MG capsule Take 1 capsule (20 mg total) by mouth daily.    Social History: Social History  Tobacco Use   Smoking status: Every Day    Packs/day: .5    Types: Cigarettes   Smokeless tobacco: Never  Vaping Use   Vaping Use: Never used  Substance Use Topics   Alcohol use: Yes    Alcohol/week: 6.0 standard drinks of alcohol    Types: 6 Cans of beer per week    Comment: six pack of beer/day   Drug use: No    Family Medical History: Family History  Problem  Relation Age of Onset   Arthritis Mother    Hypertension Father     Physical Examination: Vitals:   02/11/23 0936  BP: (!) 156/86    General: Patient is well developed, well nourished, calm, collected, and in no apparent distress. Attention to examination is appropriate.  Neck:   Supple.  Full range of motion.  Respiratory: Patient is breathing without any difficulty.   NEUROLOGICAL:     Awake, alert, oriented to person, place, and time.  Speech is clear and fluent. Fund of knowledge is appropriate.   Cranial Nerves: Pupils equal round and reactive to light.  Facial tone is symmetric.  Facial sensation is symmetric. Shoulder shrug is symmetric. Tongue protrusion is midline.  There is no pronator drift.  ROM of spine: full.    Strength: Side Biceps Triceps Deltoid Interossei Grip Wrist Ext. Wrist Flex.  R 5 5 4+ 5 5 5 5   L 5 5 5 5 5 5 5    Side Iliopsoas Quads Hamstring PF DF EHL  R 5 5 5  4+ 4+ 4+  L 5 5 5 5 5 5    Reflexes are 3+ and symmetric at the biceps, triceps, brachioradialis, patella and achilles.   Hoffman's is present.   Bilateral upper and lower extremity sensation is intact to light touch.    No evidence of dysmetria noted.  Gait is wide-based.     Medical Decision Making  Imaging: MRI L spine 07/05/2022 IMPRESSION: 1. L2-L3 mild spinal canal stenosis and mild right neural foraminal narrowing. Narrowing of the lateral recesses at this level could affect the descending L3 nerve roots. 2. L3-L4 and L4-L5 mild bilateral neural foraminal narrowing. 3. L5-S1 mild right neural foraminal narrowing. Narrowing of the right-greater-than-left lateral recess could affect the descending S1 nerve roots.     Electronically Signed   By: Wiliam Ke M.D.   On: 07/05/2022 00:18  I have personally reviewed the images and agree with the above interpretation.  Assessment and Plan: [name] is a pleasant 57 y.o. male with right-sided S1 radiculopathy due to  lateral recess stenosis at L5-S1.  He also has residual and current symptoms of cervical myelopathy.  Given his history, I would like to get a cervical spine MRI scan.  I would like him to continue physical therapy.  He is having some pain around his right hip.  I will send him for orthopedic evaluation.  I will see him back in 2 months.  I think that right-sided L5-S1 laminoforaminotomy is appropriate for him.  I would not proceed until I know the status of the cervical spine.  I discussed the planned procedure at length with the patient, including the risks, benefits, alternatives, and indications. The risks discussed include but are not limited to bleeding, infection, need for reoperation, spinal fluid leak, stroke, vision loss, anesthetic complication, coma, paralysis, and even death. I also described in detail that improvement was not guaranteed.  The patient expressed understanding of these risks, and asked that we proceed with  surgery. I described the surgery in layman's terms, and gave ample opportunity for questions, which were answered to the best of my ability.    I spent a total of 10 minutes in face-to-face and non-face-to-face activities related to this patient's care today.  Thank you for involving me in the care of this patient.      Mishayla Sliwinski K. Myer Haff MD, [facility] Neurosurgery

## 2023-03-05 ENCOUNTER — Ambulatory Visit
Admission: RE | Admit: 2023-03-05 | Discharge: 2023-03-05 | Disposition: A | Discharge status: Home / Self Care | Payer: 59 | Source: Ambulatory Visit | Attending: Neurosurgery | Admitting: Neurosurgery

## 2023-03-05 DIAGNOSIS — G959 Disease of spinal cord, unspecified: Secondary | ICD-10-CM | POA: Diagnosis present

## 2023-03-17 ENCOUNTER — Encounter: Payer: Self-pay | Admitting: Neurosurgery

## 2023-04-02 ENCOUNTER — Ambulatory Visit
Admission: EM | Admit: 2023-04-02 | Discharge: 2023-04-02 | Disposition: A | Discharge status: Home / Self Care | Payer: 59 | Attending: Emergency Medicine | Admitting: Emergency Medicine

## 2023-04-02 ENCOUNTER — Ambulatory Visit (INDEPENDENT_AMBULATORY_CARE_PROVIDER_SITE_OTHER): Payer: 59

## 2023-04-02 DIAGNOSIS — S2232XA Fracture of one rib, left side, initial encounter for closed fracture: Secondary | ICD-10-CM | POA: Diagnosis not present

## 2023-04-02 DIAGNOSIS — S298XXA Other specified injuries of thorax, initial encounter: Secondary | ICD-10-CM

## 2023-04-02 MED ORDER — METHOCARBAMOL 500 MG PO TABS: 500.0000 mg | ORAL_TABLET | Freq: Four times a day (QID) | ORAL | 0 refills | Status: AC | PRN

## 2023-04-02 MED ORDER — ACETAMINOPHEN 325 MG PO TABS
975.0000 mg | ORAL_TABLET | Freq: Once | ORAL | Status: AC
  Administered 2023-04-02: 975 mg via ORAL

## 2023-04-02 MED ORDER — IBUPROFEN 600 MG PO TABS: 600.0000 mg | ORAL_TABLET | Freq: Four times a day (QID) | ORAL | 0 refills | Status: DC | PRN

## 2023-04-02 MED ORDER — HYDROCODONE-ACETAMINOPHEN 5-325 MG PO TABS: 1.0000 | ORAL_TABLET | Freq: Four times a day (QID) | ORAL | 0 refills | Status: DC | PRN

## 2023-04-02 MED ORDER — KETOROLAC TROMETHAMINE 30 MG/ML IJ SOLN
30.0000 mg | Freq: Once | INTRAMUSCULAR | Status: AC
  Administered 2023-04-02: 30 mg via INTRAMUSCULAR

## 2023-04-02 NOTE — ED Provider Notes (Signed)
HPI  SUBJECTIVE:  Tristan Thompson is a 57 y.o. male who presents with sharp, constant left anterior chest pain after falling off of his mower 2 days ago.  States that he was trying to step off of it, misstepped, landed on his left anterior chest.  He denies hitting his head, loss of consciousness, neck pain, headache.  No bruising.  He did not feel a pop in his chest.  No fevers, coughing, wheezing, shortness of breath, hemoptysis.  He tried rest without any symptoms.  Symptoms are worse with deep inspiration, palpation, arm or torso movement, rolling over in bed, large positional changes.  He has a past medical history of chronic back pain, hypertension and is status post cervical fusion.  [facility]   Past Medical History:  Diagnosis Date   Anxiety    Chronic back pain    Hypertension    has a prescription but has not had it filled.   Umbilical hernia    has had surgery to "patch" it    Past Surgical History:  Procedure Laterality Date   ANTERIOR CERVICAL DECOMP/DISCECTOMY FUSION N/A 07/16/2013   Procedure: ANTERIOR CERVICAL DECOMPRESSION/DISCECTOMY FUSION CERVICAL THREE-FOUR,FOUR-FIVE AND CERVICAL SIX-SEVEN;  Surgeon: Reinaldo Meeker, MD;  Location: [facility];  Service: Neurosurgery;  Laterality: N/A;   BACK SURGERY     ESOPHAGOGASTRODUODENOSCOPY N/A 01/04/2017   Procedure: ESOPHAGOGASTRODUODENOSCOPY (EGD);  Surgeon: Toney Reil, MD;  Location: [facility] ENDOSCOPY;  Service: Gastroenterology;  Laterality: N/A;   HERNIA REPAIR      Family History  Problem Relation Age of Onset   Arthritis Mother    Hypertension Father     Social History   Tobacco Use   Smoking status: Every Day    Current packs/day: 0.50    Types: Cigarettes   Smokeless tobacco: Never  Vaping Use   Vaping status: Never Used  Substance Use Topics   Alcohol use: Yes    Alcohol/week: 6.0 standard drinks of alcohol    Types: 6 Cans of beer per week    Comment: six pack of beer/day   Drug  use: No    No current facility-administered medications for this encounter.  Current Outpatient Medications:    albuterol (VENTOLIN HFA) 108 (90 Base) MCG/ACT inhaler, Inhale 2 puffs into the lungs every 4 (four) hours as needed for wheezing or shortness of breath., Disp: 18 g, Rfl: 0   escitalopram (LEXAPRO) 5 MG tablet, Take 5 mg by mouth daily., Disp: , Rfl:    hydrochlorothiazide (MICROZIDE) 12.5 MG capsule, Take 12.5 mg by mouth daily., Disp: , Rfl:    HYDROcodone-acetaminophen (NORCO/VICODIN) 5-325 MG tablet, Take 1-2 tablets by mouth every 6 (six) hours as needed for moderate pain or severe pain., Disp: 12 tablet, Rfl: 0   ibuprofen (ADVIL) 600 MG tablet, Take 1 tablet (600 mg total) by mouth every 6 (six) hours as needed., Disp: 30 tablet, Rfl: 0   lovastatin (MEVACOR) 40 MG tablet, Take 1 tablet by mouth at bedtime., Disp: , Rfl:    omeprazole (PRILOSEC) 20 MG capsule, Take 1 capsule (20 mg total) by mouth daily., Disp: 30 capsule, Rfl: 0   gabapentin (NEURONTIN) 300 MG capsule, Take 300 mg by mouth daily., Disp: , Rfl:    ipratropium (ATROVENT) 0.03 % nasal spray, Place 2 sprays into both nostrils every 12 (twelve) hours., Disp: 30 mL, Rfl: 12   methocarbamol (ROBAXIN) 500 MG tablet, Take 1 tablet (500 mg total) by mouth every 6 (six) hours as  needed for up to 7 days for muscle spasms., Disp: 28 tablet, Rfl: 0  No Known Allergies   ROS  As noted in HPI.   Physical Exam  BP (!) 153/87 (BP Location: Right Arm)   Pulse 78   Temp 98.5 F (36.9 C) (Oral)   SpO2 96%   Constitutional: Well developed, well nourished, appears uncomfortable Eyes:  EOMI, conjunctiva normal bilaterally HENT: Normocephalic, atraumatic,mucus membranes moist Respiratory: Normal inspiratory effort.  Pain with inspiration.  No chest wall bruising anterior, laterally.  Positive tenderness over the left pectoral region, ribs 4/5.  Questionable crepitus.  No other chest wall tenderness. Cardiovascular:  Normal rate, regular rhythm, no murmurs rubs or gallops GI: nondistended skin: No rash, skin intact Musculoskeletal: No C-spine, T-spine tenderness. Neurologic: Alert & oriented x 3, no focal neuro deficits Psychiatric: Speech and behavior appropriate   ED Course   Medications  acetaminophen (TYLENOL) tablet 975 mg (975 mg Oral Given 04/02/23 0904)  ketorolac (TORADOL) 30 MG/ML injection 30 mg (30 mg Intramuscular Given 04/02/23 0903)    Orders Placed This Encounter  Procedures   DG Ribs Unilateral W/Chest Left    Standing Status:   Standing    Number of Occurrences:   1    Order Specific Question:   Reason for Exam (SYMPTOM  OR DIAGNOSIS REQUIRED)    Answer:   Blunt trauma anterior chest, tenderness around ribs 4/5.  Rule out pneumothorax, pleural effusion, displaced rib fracture.    No results found for this or any previous visit (from the past 24 hour(s)). DG Ribs Unilateral W/Chest Left  Addendum Date: 04/02/2023   ADDENDUM REPORT: 04/02/2023 10:17 ADDENDUM: Correction, #1 of the Impression should state: "NO left rib fracture identified radiographically.". This was discussed by telephone with Dr. Domenick Gong on 04/02/2023 at 1012 hours. Electronically Signed   By: Odessa Fleming M.D.   On: 04/02/2023 10:17   Result Date: 04/02/2023 CLINICAL DATA:  57 year old male status post fall from lawnmower, struck left chest. Rib 4/5 pain and tenderness. EXAM: LEFT RIBS AND CHEST - 3+ VIEW COMPARISON:  Chest radiographs 12/24/2022. FINDINGS: PA views of the chest and 4 oblique views of the left ribs. Lung volumes and mediastinal contours are stable and within normal limits. Chronic lower cervical and/or cervicothoracic junction ACDF hardware. No pneumothorax, pulmonary edema, pleural effusion or acute pulmonary opacity. Bilateral nipple shadows again noted. Left anterolateral 4/5 rib area is marked. Bone mineralization is within normal limits for age. No displaced rib fracture. And no nondisplaced  fracture or other rib abnormality is identified. Other visible osseous structures appear intact. Negative visible bowel gas pattern. IMPRESSION: 1. Left rib fracture identified radiographically. 2. No acute cardiopulmonary abnormality. Electronically Signed: By: Odessa Fleming M.D. On: 04/02/2023 09:35    ED Clinical Impression  1. Closed traumatic nondisplaced fracture of one rib of left side, initial encounter   2. Blunt trauma to chest, initial encounter      ED Assessment/Plan     Concern for rib fractures.  Giving Toradol 30 mg IM, Tylenol 75 mg p.o., will obtain left rib series.  Patient denies hitting his head, headache, loss of consciousness, neck pain, other injury..  [place] Narcotic database reviewed for this patient, and feel that the risk/benefit ratio today is favorable for proceeding with a prescription for controlled substance.  No opiate prescriptions in the past 2 years.  He does have a diazepam prescription.  Reviewed imaging independently.  No pneumothorax, pulm edema, pleural effusion or acute pulmonary  opacity.  Discussed with radiology.  No displaced rib fracture. See radiology report for full details.  Clinically, I suspect a nondisplaced rib fracture.  Home with ibuprofen and a Tylenol containing product 3 times a day as needed for pain.  Either 1000 mg plain Tylenol for mild to moderate pain, 1-2 Norco for severe pain.  Will refill Robaxin.  Advised patient to not take diazepam if taking the Norco as this can cause respiratory depression.  Follow-up with PCP as needed.  Advised patient that he will be sore for several weeks.  ER return precautions given.  Discussed  imaging, MDM, treatment plan, and plan for follow-up with patient. Discussed sn/sx that should prompt return to the ED. patient agrees with plan.   Meds ordered this encounter  Medications   acetaminophen (TYLENOL) tablet 975 mg   ketorolac (TORADOL) 30 MG/ML injection 30 mg   methocarbamol (ROBAXIN) 500 MG tablet     Sig: Take 1 tablet (500 mg total) by mouth every 6 (six) hours as needed for up to 7 days for muscle spasms.    Dispense:  28 tablet    Refill:  0   HYDROcodone-acetaminophen (NORCO/VICODIN) 5-325 MG tablet    Sig: Take 1-2 tablets by mouth every 6 (six) hours as needed for moderate pain or severe pain.    Dispense:  12 tablet    Refill:  0   ibuprofen (ADVIL) 600 MG tablet    Sig: Take 1 tablet (600 mg total) by mouth every 6 (six) hours as needed.    Dispense:  30 tablet    Refill:  0      *This clinic note was created using Scientist, clinical (histocompatibility and immunogenetics). Therefore, there may be occasional mistakes despite careful proofreading.  ?    Domenick Gong, MD 04/02/23 1133

## 2023-04-02 NOTE — Discharge Instructions (Signed)
There is no displaced rib fracture lung, lung bruise, or fluid in your lung.  However, I suspect that you have a nondisplaced rib fracture which is difficult to pick up on x-ray.  Take 600 mg of ibuprofen with a Tylenol containing product 3 times a day.  Either take at 1000 mg of plain Tylenol for mild to moderate pain or 1-2 Norco for severe pain.  Do not take the diazepam if you are taking the Norco as this can you to stop breathing.  This will take several weeks to resolve.  Go to the ER for fevers above 100.4, shortness of breath, if you start coughing up blood, or for any concerns.

## 2023-04-02 NOTE — ED Triage Notes (Addendum)
Pt states he fell off his lawnmower while getting off of it on a hill, missed step and fell onto LT side of chest, pt reports he is having pain when inhaling, when laying down, and also some upper back pain DOI: 03/31/23

## 2023-04-03 ENCOUNTER — Ambulatory Visit: Payer: 59 | Admitting: Neurosurgery

## 2023-04-29 ENCOUNTER — Ambulatory Visit: Payer: 59 | Admitting: Neurosurgery

## 2023-05-20 ENCOUNTER — Ambulatory Visit: Payer: 59 | Admitting: Neurosurgery

## 2023-05-23 NOTE — Progress Notes (Deleted)
Referring Physician:  No referring provider defined for this encounter.  Primary Physician:  Tristan Ivan, MD  History of Present Illness: 05/23/2023*** [name] has a history of ***  Last seen by Dr. Myer Thompson on 02/11/23.   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   He also has residual and current symptoms of cervical myelopathy. MRI of cervical spine was ordered prior to consideration of right sided L5-S1 laminoforaminotomy.   He is here to review his cervical MRI scan.    See yarbrough's mychart messate on 03/17/23.    Duration: *** Location: *** Quality: *** Severity: ***  Precipitating: aggravated by *** Modifying factors: made better by *** Weakness: none Timing: ***  Bowel/Bladder Dysfunction: none  Conservative measures:  Physical therapy: has participated at [facility] 02/2021. Stopped due to cost, but continued with home exercises.  Multimodal medical therapy including regular antiinflammatories: baclofen, ibuprofen, tylenol, aspirin  Injections:  has received epidural steroid injections 05/23/2022: Right L5-S1 and right S1 transforaminal ESI (no relief, Celestone 12 mg)  04/12/2022: Right L5-S1 and right S1 transforaminal ESI (temporary relief, dexamethasone 10 mg, Dr. Mariah Thompson)    Past Surgery: 07/16/13: C3-7 ACDF by Dr. Ardelia Thompson has symptoms of cervical myelopathy. The symptoms are causing a significant impact on the patient's life.   Review of Systems:  A 10 point review of systems is negative, except for the pertinent positives and negatives detailed in the HPI.  Past Medical History: Past Medical History:  Diagnosis Date   Anxiety    Chronic back pain    Hypertension    has a prescription but has not had it filled.   Umbilical hernia    has had surgery to "patch" it    Past Surgical History: Past Surgical History:  Procedure Laterality  Date   ANTERIOR CERVICAL DECOMP/DISCECTOMY FUSION N/A 07/16/2013   Procedure: ANTERIOR CERVICAL DECOMPRESSION/DISCECTOMY FUSION CERVICAL THREE-FOUR,FOUR-FIVE AND CERVICAL SIX-SEVEN;  Surgeon: Tristan Meeker, MD;  Location: [facility] NEURO ORS;  Service: Neurosurgery;  Laterality: N/A;   BACK SURGERY     ESOPHAGOGASTRODUODENOSCOPY N/A 01/04/2017   Procedure: ESOPHAGOGASTRODUODENOSCOPY (EGD);  Surgeon: Tristan Reil, MD;  Location: [facility] ENDOSCOPY;  Service: Gastroenterology;  Laterality: N/A;   HERNIA REPAIR      Allergies: Allergies as of 05/27/2023   (No Known Allergies)    Medications: Outpatient Encounter Medications as of 05/27/2023  Medication Sig   albuterol (VENTOLIN HFA) 108 (90 Base) MCG/ACT inhaler Inhale 2 puffs into the lungs every 4 (four) hours as needed for wheezing or shortness of breath.   escitalopram (LEXAPRO) 5 MG tablet Take 5 mg by mouth daily.   gabapentin (NEURONTIN) 300 MG capsule Take 300 mg by mouth daily.   hydrochlorothiazide (MICROZIDE) 12.5 MG capsule Take 12.5 mg by mouth daily.   HYDROcodone-acetaminophen (NORCO/VICODIN) 5-325 MG tablet Take 1-2 tablets by mouth every 6 (six) hours as needed for moderate pain or severe pain.   ibuprofen (ADVIL) 600 MG tablet Take 1 tablet (600 mg total) by mouth every 6 (six) hours as needed.   ipratropium (ATROVENT) 0.03 % nasal spray Place 2 sprays into both nostrils every 12 (twelve) hours.   lovastatin (MEVACOR) 40 MG tablet Take 1 tablet by mouth at bedtime.   omeprazole (PRILOSEC) 20 MG capsule Take 1 capsule (20 mg total) by mouth daily.   No facility-administered encounter medications on file as of 05/27/2023.  Social History: Social History   Tobacco Use   Smoking status: Every Day    Current packs/day: 0.50    Types: Cigarettes   Smokeless tobacco: Never  Vaping Use   Vaping status: Never Used  Substance Use Topics   Alcohol use: Yes    Alcohol/week: 6.0 standard drinks of alcohol    Types: 6 Cans of  beer per week    Comment: six pack of beer/day   Drug use: No    Family Medical History: Family History  Problem Relation Age of Onset   Arthritis Mother    Hypertension Father     Physical Examination: There were no vitals filed for this visit.  General: Patient is well developed, well nourished, calm, collected, and in no apparent distress. Attention to examination is appropriate.  Respiratory: Patient is breathing without any difficulty.   NEUROLOGICAL:     Awake, alert, oriented to person, place, and time.  Speech is clear and fluent. Fund of knowledge is appropriate.   Cranial Nerves: Pupils equal round and reactive to light.  Facial tone is symmetric.    *** ROM of cervical spine *** pain *** posterior cervical tenderness. *** tenderness in bilateral trapezial region.   *** ROM of lumbar spine *** pain *** posterior lumbar tenderness.   No abnormal lesions on exposed skin.   Strength: Side Biceps Triceps Deltoid Interossei Grip Wrist Ext. Wrist Flex.  R 5 5 4+*** 5 5 5 5   L 5 5 5 5 5 5 5    Side Iliopsoas Quads Hamstring PF DF EHL  R 5 5 5  4+*** 4+*** 4+***  L 5 5 5 5 5 5    Reflexes are ***3+ and symmetric at the biceps, brachioradialis, patella and achilles.   Hoffman's is positive*** Clonus is not present.   Bilateral upper and lower extremity sensation is intact to light touch.     Gait is normal.   ***No difficulty with tandem gait.    Medical Decision Making  Imaging: MRI cervical spine dated 03/05/23:  FINDINGS: Alignment: Minimal anterolisthesis of C7 on T1 secondary to facet disease.   Vertebrae: No acute fracture, evidence of discitis, or aggressive bone lesion.   Cord: Stable myelomalacia and volume loss of the cervical spinal cord at the level C4-5.   Posterior Fossa, vertebral arteries, paraspinal tissues: Posterior fossa demonstrates no focal abnormality. Vertebral artery flow voids are maintained. Paraspinal soft tissues are  unremarkable.   Disc levels:   Discs: Anterior cervical fusion from C3 through C5 and C6-7. Degenerative disease with mild disc height loss at C7-T1.   C2-3: No disc protrusion. Mild left foraminal stenosis. No right foraminal stenosis. No spinal stenosis.   C3-4: Interbody fusion.  No foraminal or central canal stenosis.   C4-5: Interbody fusion. Bilateral foraminal osseous ridging. Mild bilateral foraminal stenosis. No spinal stenosis.   C5-6: No disc protrusion.  No foraminal or central canal stenosis.   C6-7: Interbody fusion. Bilateral foraminal osseous ridging. Severe left foraminal stenosis. Moderate-severe right foraminal stenosis. No spinal stenosis.   C7-T1: Broad-based disc bulge. Moderate bilateral facet arthropathy. Moderate spinal stenosis. Severe bilateral foraminal stenosis.   IMPRESSION: 1. Anterior cervical fusion from C3 through C5 and C6-7. At C6-7 there is bilateral foraminal osseous ridging, severe left foraminal stenosis and moderate-severe right foraminal stenosis. 2. At C7-T1 there is a broad-based disc bulge. Moderate bilateral facet arthropathy. Moderate spinal stenosis. Severe bilateral foraminal stenosis.     Electronically Signed   By: Dillard Cannon.D.  On: 03/12/2023 14:55  I have personally reviewed the images and agree with the above interpretation.  Assessment and Plan: Mr. Peavey is a pleasant 57 y.o. male has ***  Treatment options discussed with patient and following plan made:   - Order for physical therapy for *** spine ***. Patient to call to schedule appointment. *** - Continue current medications including ***. Reviewed dosing and side effects.  - Prescription for ***. Reviewed dosing and side effects. Take with food.  - Prescription for *** to take prn muscle spasms. Reviewed dosing and side effects. Discussed this can cause drowsiness.  - MRI of *** to further evaluate *** radiculopathy. No improvement time or medications  (***).  - Referral to PMR at [facility] to discuss possible *** injections.  - Will schedule phone visit to review MRI results once I get them back.   I spent a total of *** minutes in face-to-face and non-face-to-face activities related to this patient's care today including review of outside records, review of imaging, review of symptoms, physical exam, discussion of differential diagnosis, discussion of treatment options, and documentation.   Thank you for involving me in the care of this patient.   Drake Leach PA-C Dept. of Neurosurgery

## 2023-05-27 ENCOUNTER — Ambulatory Visit: Payer: 59 | Admitting: Orthopedic Surgery

## 2023-05-27 ENCOUNTER — Ambulatory Visit (INDEPENDENT_AMBULATORY_CARE_PROVIDER_SITE_OTHER): Payer: 59 | Admitting: Neurosurgery

## 2023-05-27 ENCOUNTER — Encounter: Payer: Self-pay | Admitting: Neurosurgery

## 2023-05-27 VITALS — BP 136/80 | Ht 74.0 in | Wt 216.0 lb

## 2023-05-27 DIAGNOSIS — G959 Disease of spinal cord, unspecified: Secondary | ICD-10-CM

## 2023-05-27 DIAGNOSIS — G894 Chronic pain syndrome: Secondary | ICD-10-CM

## 2023-05-27 DIAGNOSIS — R1031 Right lower quadrant pain: Secondary | ICD-10-CM

## 2023-05-27 DIAGNOSIS — M545 Low back pain, unspecified: Secondary | ICD-10-CM

## 2023-05-27 NOTE — Progress Notes (Signed)
Referring Physician:  No referring provider defined for this encounter.  Primary Physician:  Marisue Ivan, MD  History of Present Illness: 05/27/2023 [name] has a history of cervical myelopathy status post multilevel cervical intervention in 2014.  He more recently has had some symptoms of lumbar radiculopathy.  That being said, his primary symptom today is back pain.  He has little radiculopathy.  He also reports a couple of months of right groin pain, though he is currently still working without any issues.  Physical therapy and medications have not helped.  02/11/2023 [name] continues to have symptoms as noted below.  Due to unforeseen circumstances, he was unable to pursue physical therapy or get his MRI scan of the cervical spine.   07/09/2022 [name] is here today with a chief complaint of low back pain that radiates into the right buttock, lateral leg and into the foot. He also complains of weakness in the right leg.  He has been having issues since 2014.  He had neck surgery at that time for cervical myelopathy and severe cervical stenosis.  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   He does have some recent symptoms of fall, difficulty with dexterity, and numbness in his hands.   More recently, he has been bothered by pain into his right leg.  He has pain with walking more than 50 feet.  He has pain into his right buttock.  Standing and sitting cause pain.     Bowel/Bladder Dysfunction: none   Conservative measures:  Physical therapy: has participated at [facility] 02/2021. Stopped due to cost, but continued with home exercises.  Multimodal medical therapy including regular antiinflammatories: baclofen, ibuprofen, tylenol, aspirin  Injections:  has received epidural steroid injections 05/23/2022: Right L5-S1 and right S1 transforaminal ESI (no relief, Celestone 12 mg)  04/12/2022: Right L5-S1 and right S1 transforaminal ESI  (temporary relief, dexamethasone 10 mg, Dr. Mariah Milling)    Past Surgery: 07/16/13: C3-7 ACDF by Dr. Ardelia Mems has symptoms of cervical myelopathy.  Review of Systems:  A 10 point review of systems is negative, except for the pertinent positives and negatives detailed in the HPI.  Past Medical History: Past Medical History:  Diagnosis Date   Anxiety    Chronic back pain    Hypertension    has a prescription but has not had it filled.   Umbilical hernia    has had surgery to "patch" it    Past Surgical History: Past Surgical History:  Procedure Laterality Date   ANTERIOR CERVICAL DECOMP/DISCECTOMY FUSION N/A 07/16/2013   Procedure: ANTERIOR CERVICAL DECOMPRESSION/DISCECTOMY FUSION CERVICAL THREE-FOUR,FOUR-FIVE AND CERVICAL SIX-SEVEN;  Surgeon: Reinaldo Meeker, MD;  Location: [facility] NEURO ORS;  Service: Neurosurgery;  Laterality: N/A;   BACK SURGERY     ESOPHAGOGASTRODUODENOSCOPY N/A 01/04/2017   Procedure: ESOPHAGOGASTRODUODENOSCOPY (EGD);  Surgeon: Toney Reil, MD;  Location: [facility] ENDOSCOPY;  Service: Gastroenterology;  Laterality: N/A;   HERNIA REPAIR      Allergies: Allergies as of 05/27/2023   (No Known Allergies)    Medications: Outpatient Encounter Medications as of 05/27/2023  Medication Sig   albuterol (VENTOLIN HFA) 108 (90 Base) MCG/ACT inhaler Inhale 2 puffs into the lungs every 4 (four) hours as needed for wheezing or shortness of breath.   escitalopram (LEXAPRO) 5 MG tablet Take 5 mg by mouth daily.   hydrochlorothiazide (MICROZIDE) 12.5 MG capsule Take 12.5 mg by mouth daily.   ibuprofen (ADVIL) 600  MG tablet Take 1 tablet (600 mg total) by mouth every 6 (six) hours as needed.   ipratropium (ATROVENT) 0.03 % nasal spray Place 2 sprays into both nostrils every 12 (twelve) hours.   methocarbamol (ROBAXIN) 500 MG tablet Take 500 mg by mouth every 8 (eight) hours as needed.   omeprazole (PRILOSEC) 20 MG capsule Take 1 capsule (20 mg total) by mouth  daily.   lovastatin (MEVACOR) 40 MG tablet Take 1 tablet by mouth at bedtime.   [DISCONTINUED] gabapentin (NEURONTIN) 300 MG capsule Take 300 mg by mouth daily. (Patient not taking: Reported on 05/27/2023)   [DISCONTINUED] HYDROcodone-acetaminophen (NORCO/VICODIN) 5-325 MG tablet Take 1-2 tablets by mouth every 6 (six) hours as needed for moderate pain or severe pain.   No facility-administered encounter medications on file as of 05/27/2023.    Social History: Social History   Tobacco Use   Smoking status: Every Day    Current packs/day: 0.50    Types: Cigarettes   Smokeless tobacco: Never  Vaping Use   Vaping status: Never Used  Substance Use Topics   Alcohol use: Yes    Alcohol/week: 6.0 standard drinks of alcohol    Types: 6 Cans of beer per week    Comment: six pack of beer/day   Drug use: No    Family Medical History: Family History  Problem Relation Age of Onset   Arthritis Mother    Hypertension Father     Physical Examination: Vitals:   05/27/23 1148  BP: 136/80    General: Patient is well developed, well nourished, calm, collected, and in no apparent distress. Attention to examination is appropriate.  Respiratory: Patient is breathing without any difficulty.   NEUROLOGICAL:     Awake, alert, oriented to person, place, and time.  Speech is clear and fluent. Fund of knowledge is appropriate.   Cranial Nerves: Pupils equal round and reactive to light.  Facial tone is symmetric.    No abnormal lesions on exposed skin.   Strength: Side Biceps Triceps Deltoid Interossei Grip Wrist Ext. Wrist Flex.  R 5 5 5 5 5 5 5   L 5 5 5 5 5 5 5    Side Iliopsoas Quads Hamstring PF DF EHL  R 5 5 5 5 5 5   L 5 5 5 5 5 5    Reflexes are 3+ and symmetric at the biceps, brachioradialis, patella and achilles.    Bilateral upper and lower extremity sensation is intact to light touch.     Gait is abnormal with abnormal movement of RLE.    Straight leg raise is negative at 60  degrees bilaterally.   Medical Decision Making  Imaging: MRI cervical spine dated 03/05/23:  FINDINGS: Alignment: Minimal anterolisthesis of C7 on T1 secondary to facet disease.   Vertebrae: No acute fracture, evidence of discitis, or aggressive bone lesion.   Cord: Stable myelomalacia and volume loss of the cervical spinal cord at the level C4-5.   Posterior Fossa, vertebral arteries, paraspinal tissues: Posterior fossa demonstrates no focal abnormality. Vertebral artery flow voids are maintained. Paraspinal soft tissues are unremarkable.   Disc levels:   Discs: Anterior cervical fusion from C3 through C5 and C6-7. Degenerative disease with mild disc height loss at C7-T1.   C2-3: No disc protrusion. Mild left foraminal stenosis. No right foraminal stenosis. No spinal stenosis.   C3-4: Interbody fusion.  No foraminal or central canal stenosis.   C4-5: Interbody fusion. Bilateral foraminal osseous ridging. Mild bilateral foraminal stenosis. No spinal stenosis.  C5-6: No disc protrusion.  No foraminal or central canal stenosis.   C6-7: Interbody fusion. Bilateral foraminal osseous ridging. Severe left foraminal stenosis. Moderate-severe right foraminal stenosis. No spinal stenosis.   C7-T1: Broad-based disc bulge. Moderate bilateral facet arthropathy. Moderate spinal stenosis. Severe bilateral foraminal stenosis.   IMPRESSION: 1. Anterior cervical fusion from C3 through C5 and C6-7. At C6-7 there is bilateral foraminal osseous ridging, severe left foraminal stenosis and moderate-severe right foraminal stenosis. 2. At C7-T1 there is a broad-based disc bulge. Moderate bilateral facet arthropathy. Moderate spinal stenosis. Severe bilateral foraminal stenosis.     Electronically Signed   By: Elige Ko M.D.   On: 03/12/2023 14:55  MRI L spine 07/05/2022 IMPRESSION: 1. L2-L3 mild spinal canal stenosis and mild right neural foraminal narrowing. Narrowing of the  lateral recesses at this level could affect the descending L3 nerve roots. 2. L3-L4 and L4-L5 mild bilateral neural foraminal narrowing. 3. L5-S1 mild right neural foraminal narrowing. Narrowing of the right-greater-than-left lateral recess could affect the descending S1 nerve roots.     Electronically Signed   By: Wiliam Ke M.D.   On: 07/05/2022 00:18  I have personally reviewed the images and agree with the above interpretation.  Assessment and Plan: Mr. Becknell is a pleasant 57 y.o. male with primarily back pain.  He previously had symptoms consistent with a right S1 radiculopathy, but the symptoms are less prominent.     Lower Back Pain Chronic lower back pain with weakness in legs, particularly the right leg. No significant relief from physical therapy or medications. MRI from October 2023 shows potential nerve impingement on the right side. However, the patient's symptoms have shifted from leg pain to predominantly back pain, making surgical intervention less likely to be beneficial. -Consider pain management consultation for further treatment options, including possible facet injections  Groin Pain New onset of sharp pain in the groin area for the past 1-2 months. No previous history of similar pain. -Observe and monitor the groin pain. If it persists or worsens, further evaluation may be necessary.  Cervical Spine Prior surgery for myelomalacia and cervical stenosis. Recent MRI shows mild to moderate changes below the surgical site, a common occurrence known as adjacent segment disease. No current symptoms suggesting worsening of this condition. -Continue monitoring the cervical spine condition. If the patient's walking deteriorates over time, re-evaluate for potential worsening of the cervical spine condition.  Follow-up with Stacy in 3-6 months.  Venetia Night MD Dept. of Neurosurgery

## 2023-05-28 ENCOUNTER — Ambulatory Visit: Payer: 59 | Admitting: Orthopedic Surgery

## 2023-05-28 ENCOUNTER — Ambulatory Visit
Admission: EM | Admit: 2023-05-28 | Discharge: 2023-05-28 | Disposition: A | Discharge status: Home / Self Care | Payer: 59 | Attending: Family Medicine | Admitting: Family Medicine

## 2023-05-28 DIAGNOSIS — U071 COVID-19: Secondary | ICD-10-CM | POA: Diagnosis present

## 2023-05-28 LAB — SARS CORONAVIRUS 2 BY RT PCR: SARS Coronavirus 2 by RT PCR: POSITIVE — AB

## 2023-05-28 MED ORDER — ALBUTEROL SULFATE HFA 108 (90 BASE) MCG/ACT IN AERS: 2.0000 | INHALATION_SPRAY | RESPIRATORY_TRACT | 0 refills | Status: DC | PRN

## 2023-05-28 MED ORDER — PAXLOVID (300/100) 20 X 150 MG & 10 X 100MG PO TBPK: 3.0000 | ORAL_TABLET | Freq: Two times a day (BID) | ORAL | 0 refills | Status: AC

## 2023-05-28 MED ORDER — IPRATROPIUM BROMIDE 0.03 % NA SOLN: 2.0000 | Freq: Two times a day (BID) | NASAL | 0 refills | Status: DC

## 2023-05-28 NOTE — ED Triage Notes (Signed)
fever,chills,runny nose,ha,sore throat bilateral ear pain. 1 day

## 2023-05-28 NOTE — Discharge Instructions (Addendum)

## 2023-05-28 NOTE — ED Provider Notes (Signed)
[facility]    CSN: [id number] Arrival date & time: 05/28/23  4098      History   Chief Complaint No chief complaint on file.   HPI LAYMOND TAYMAN is a 57 y.o. male.   HPI  History obtained from the patient. Jurgen presents for fever, chills, rhinorrhea, headache, sore throat, bilateral ear pain that started 2 nights ago.  States that he just felt hot and knew he had a fever but did not take his temperature.  Endorses diarrhea.  Denies nausea, vomiting, abdominal pain.  No known sick contacts but he did go to a party at the park that had about 30 attendees.  He has been taking Alka-Seltzer cold and flu for his symptoms with some relief.  He has been sleeping more than normal.      Past Medical History:  Diagnosis Date   Anxiety    Chronic back pain    Hypertension    has a prescription but has not had it filled.   Umbilical hernia    has had surgery to "patch" it    Patient Active Problem List   Diagnosis Date Noted   Alcohol dependence (HCC) 01/21/2017   Anxiety 01/21/2017   Chronic back pain 01/21/2017   Hypertension 01/21/2017   Seasonal allergies 01/21/2017    Past Surgical History:  Procedure Laterality Date   ANTERIOR CERVICAL DECOMP/DISCECTOMY FUSION N/A 07/16/2013   Procedure: ANTERIOR CERVICAL DECOMPRESSION/DISCECTOMY FUSION CERVICAL THREE-FOUR,FOUR-FIVE AND CERVICAL SIX-SEVEN;  Surgeon: Reinaldo Meeker, MD;  Location: [facility];  Service: Neurosurgery;  Laterality: N/A;   BACK SURGERY     ESOPHAGOGASTRODUODENOSCOPY N/A 01/04/2017   Procedure: ESOPHAGOGASTRODUODENOSCOPY (EGD);  Surgeon: Toney Reil, MD;  Location: [facility];  Service: Gastroenterology;  Laterality: N/A;   HERNIA REPAIR         Home Medications    Prior to Admission medications   Medication Sig Start Date End Date Taking? Authorizing Provider  escitalopram (LEXAPRO) 5 MG tablet Take 5 mg by mouth daily.   Yes [provider]  hydrochlorothiazide  (MICROZIDE) 12.5 MG capsule Take 12.5 mg by mouth daily.   Yes [provider]  nirmatrelvir & ritonavir (PAXLOVID, 300/100,) 20 x 150 MG & 10 x 100MG  TBPK Take 3 tablets by mouth 2 (two) times daily for 5 days. Do not take your cholesterol medication while taking Paxlovid. 05/28/23 06/02/23 Yes Braxten Memmer, DO  albuterol (VENTOLIN HFA) 108 (90 Base) MCG/ACT inhaler Inhale 2 puffs into the lungs every 4 (four) hours as needed for wheezing or shortness of breath. 05/28/23   Katha Cabal, DO  ibuprofen (ADVIL) 600 MG tablet Take 1 tablet (600 mg total) by mouth every 6 (six) hours as needed. 04/02/23   Domenick Gong, MD  ipratropium (ATROVENT) 0.03 % nasal spray Place 2 sprays into both nostrils every 12 (twelve) hours. 05/28/23   Katha Cabal, DO  lovastatin (MEVACOR) 40 MG tablet Take 1 tablet by mouth at bedtime. 01/01/22 04/02/23  [provider]  methocarbamol (ROBAXIN) 500 MG tablet Take 500 mg by mouth every 8 (eight) hours as needed. 04/02/23   [provider]  omeprazole (PRILOSEC) 20 MG capsule Take 1 capsule (20 mg total) by mouth daily. 12/24/22   Rodriguez-Southworth, Nettie Elm, PA-C    Family History Family History  Problem Relation Age of Onset   Arthritis Mother    Hypertension Father     Social History Social History   Tobacco Use   Smoking status: Every Day  Current packs/day: 0.50    Types: Cigarettes   Smokeless tobacco: Never  Vaping Use   Vaping status: Never Used  Substance Use Topics   Alcohol use: Yes    Alcohol/week: 6.0 standard drinks of alcohol    Types: 6 Cans of beer per week    Comment: six pack of beer/day   Drug use: No     Allergies   Patient has no known allergies.   Review of Systems Review of Systems: negative unless otherwise stated in HPI.      Physical Exam Triage Vital Signs ED Triage Vitals [05/28/23 0819]  Encounter Vitals Group     BP (!) 144/87     Systolic BP Percentile      Diastolic BP  Percentile      Pulse Rate 65     Resp 18     Temp 98.4 F (36.9 C)     Temp Source Oral     SpO2 97 %     Weight 212 lb (96.2 kg)     Height      Head Circumference      Peak Flow      Pain Score      Pain Loc      Pain Education      Exclude from Growth Chart    No data found.  Updated Vital Signs BP (!) 144/87 (BP Location: Right Arm)   Pulse 65   Temp 98.4 F (36.9 C) (Oral)   Resp 18   Wt 96.2 kg   SpO2 97%   BMI 27.22 kg/m   Visual Acuity Right Eye Distance:   Left Eye Distance:   Bilateral Distance:    Right Eye Near:   Left Eye Near:    Bilateral Near:     Physical Exam GEN:     alert, ill but non-toxic appearing male in no distress    HENT:  mucus membranes moist, oropharyngeal without lesions, moderate erythema, no tonsillar hypertrophy or exudates,  moderate erythematous edematous turbinates, clear nasal discharge, bilateral TM erythema without bulging or opaqueness EYES:   pupils equal and reactive, no scleral injection or discharge NECK:  normal ROM, no meningismus   RESP:  no increased work of breathing, clear to auscultation bilaterally CVS:   regular rate and rhythm Skin:   warm and dry    UC Treatments / Results  Labs (all labs ordered are listed, but only abnormal results are displayed) Labs Reviewed  SARS CORONAVIRUS 2 BY RT PCR - Abnormal; Notable for the following components:      Result Value   SARS Coronavirus 2 by RT PCR POSITIVE (*)    All other components within normal limits    EKG   Radiology No results found.  Procedures Procedures (including critical care time)  Medications Ordered in UC Medications - No data to display  Initial Impression / Assessment and Plan / UC Course  I have reviewed the triage vital signs and the nursing notes.  Pertinent labs & imaging results that were available during my care of the patient were reviewed by me and considered in my medical decision making (see chart for details).        Pt is a 57 y.o. male who presents for 2 days of respiratory symptoms. Zyquez is afebrile here without recent antipyretics. Satting well on room air. Overall pt is non-toxic appearing, well hydrated, without respiratory distress. Pulmonary exam is unremarkable.  COVID testing obtained and was negative.  Strep  testing is declined.  History most consistent with viral respiratory illness. Discussed symptomatic treatment.  Explained lack of efficacy of antibiotics in viral disease.  Typical duration of symptoms discussed.  Albuterol inhaler, Pratropium nasal spray, Paxlovid prescribed.  Patient has taken Paxlovid before without issues.  Advised not to take his cholesterol medication while taking Paxlovid..  Return and ED precautions given and voiced understanding. Discussed MDM, treatment plan and plan for follow-up with patient who agrees with plan.     Final Clinical Impressions(s) / UC Diagnoses   Final diagnoses:  COVID-19     Discharge Instructions      Your test for COVID-19 was positive, meaning that you were infected with the novel coronavirus and could give the germ to others.  The recommendations suggest returning to normal activities when, for at least 24 hours, symptoms are improving overall, and if a fever was present, it has been gone without use of a fever-reducing medication.  You should wear a mask for the next 5 days to prevent the spread of disease. Please continue good preventive care measures, including:  frequent hand-washing, avoid touching your face, cover coughs/sneezes, stay out of crowds and keep a 6 foot distance from others.  Go to the nearest hospital emergency room if fever/cough/breathlessness are severe or illness seems like a threat to life.  If your were prescribed medication. Stop by the pharmacy to pick it up. You can take Tylenol and/or Ibuprofen as needed for fever reduction and pain relief.    For cough: honey 1/2 to 1 teaspoon (you can dilute the  honey in water or another fluid).  You can also use guaifenesin and dextromethorphan for cough. You can use a humidifier for chest congestion and cough.  If you don't have a humidifier, you can sit in the bathroom with the hot shower running.      For sore throat: try warm salt water gargles, Mucinex sore throat cough drops or cepacol lozenges, throat spray, warm tea or water with lemon/honey, popsicles or ice, or OTC cold relief medicine for throat discomfort. You can also purchase chloraseptic spray at the pharmacy or dollar store.   For congestion: take a daily anti-histamine like Zyrtec, Claritin, and a oral decongestant, such as pseudoephedrine.  You can also use Flonase 1-2 sprays in each nostril daily. Afrin is also a good option, if you do not have high blood pressure.    It is important to stay hydrated: drink plenty of fluids (water, gatorade/powerade/pedialyte, juices, or teas) to keep your throat moisturized and help further relieve irritation/discomfort.    Return or go to the Emergency Department if symptoms worsen or do not improve in the next few days      ED Prescriptions     Medication Sig Dispense Auth. Provider   albuterol (VENTOLIN HFA) 108 (90 Base) MCG/ACT inhaler Inhale 2 puffs into the lungs every 4 (four) hours as needed for wheezing or shortness of breath. 18 g Payton Moder, DO   ipratropium (ATROVENT) 0.03 % nasal spray Place 2 sprays into both nostrils every 12 (twelve) hours. 30 mL Calven Gilkes, DO   nirmatrelvir & ritonavir (PAXLOVID, 300/100,) 20 x 150 MG & 10 x 100MG  TBPK Take 3 tablets by mouth 2 (two) times daily for 5 days. Do not take your cholesterol medication while taking Paxlovid. 30 tablet Katha Cabal, DO      PDMP not reviewed this encounter.   Katha Cabal, DO 05/28/23 [id number]

## 2023-08-27 ENCOUNTER — Ambulatory Visit (INDEPENDENT_AMBULATORY_CARE_PROVIDER_SITE_OTHER): Payer: 59

## 2023-08-27 ENCOUNTER — Ambulatory Visit
Admission: EM | Admit: 2023-08-27 | Discharge: 2023-08-27 | Disposition: A | Discharge status: Home / Self Care | Payer: 59 | Attending: Family Medicine | Admitting: Family Medicine

## 2023-08-27 DIAGNOSIS — F17210 Nicotine dependence, cigarettes, uncomplicated: Secondary | ICD-10-CM

## 2023-08-27 DIAGNOSIS — R051 Acute cough: Secondary | ICD-10-CM | POA: Diagnosis not present

## 2023-08-27 DIAGNOSIS — J209 Acute bronchitis, unspecified: Secondary | ICD-10-CM

## 2023-08-27 MED ORDER — PREDNISONE 10 MG (21) PO TBPK: ORAL_TABLET | Freq: Every day | ORAL | 0 refills | Status: DC

## 2023-08-27 MED ORDER — AZITHROMYCIN 250 MG PO TABS: ORAL_TABLET | ORAL | 0 refills | Status: DC

## 2023-08-27 MED ORDER — ALBUTEROL SULFATE HFA 108 (90 BASE) MCG/ACT IN AERS: 2.0000 | INHALATION_SPRAY | RESPIRATORY_TRACT | 0 refills | Status: DC | PRN

## 2023-08-27 NOTE — Discharge Instructions (Addendum)
Your chest xray did not show evidence of pneumonia though the radiologist has not yet read it. If they find something that I didn't, I will call you.    Stop by the pharmacy to pick up your antibiotics and steroids.  Follow up with your primary care provider or return to the urgent care, if not improving.

## 2023-08-27 NOTE — ED Triage Notes (Signed)
cough, congestion , headache, fatiage, upset stomach aleast 1 month

## 2023-08-27 NOTE — ED Provider Notes (Signed)
[facility]    CSN: [id number] Arrival date & time: 08/27/23  4098      History   Chief Complaint Chief Complaint  Patient presents with   Cough   Nasal Congestion   Headache    HPI Tristan Thompson is a 57 y.o. male.   HPI  History obtained from the patient. Tristan Thompson presents for intermittent headache, chest congestion, nasal congestion, fatigue.  The cough started about a month ago.  Had similar sx when he had COVID. Has weird taste in the morning.  He has been off work since 08/20/23 and doesn't have any energy.  He gets more tired around 6 PM then goes to bed a few hours later.  He previously took Land but hasn't taken them recently.  Doesn't think he had a fever.  No sore throat, vomiting, nausea and abdominal pain. He had diarrhea yesterday.  Has intermittent rhinorrhea. Uses a nasal spray to help. His wife is coughing too.He smokes about a pack a day.  No history of asthma or COPD.   Took his hydrochlorothiazide about 20 minutes prior to arrival.    Past Medical History:  Diagnosis Date   Anxiety    Chronic back pain    Hypertension    has a prescription but has not had it filled.   Umbilical hernia    has had surgery to "patch" it    Patient Active Problem List   Diagnosis Date Noted   Alcohol dependence (HCC) 01/21/2017   Anxiety 01/21/2017   Chronic back pain 01/21/2017   Hypertension 01/21/2017   Seasonal allergies 01/21/2017    Past Surgical History:  Procedure Laterality Date   ANTERIOR CERVICAL DECOMP/DISCECTOMY FUSION N/A 07/16/2013   Procedure: ANTERIOR CERVICAL DECOMPRESSION/DISCECTOMY FUSION CERVICAL THREE-FOUR,FOUR-FIVE AND CERVICAL SIX-SEVEN;  Surgeon: Reinaldo Meeker, MD;  Location: [facility];  Service: Neurosurgery;  Laterality: N/A;   BACK SURGERY     ESOPHAGOGASTRODUODENOSCOPY N/A 01/04/2017   Procedure: ESOPHAGOGASTRODUODENOSCOPY (EGD);  Surgeon: Toney Reil, MD;  Location: [facility] ENDOSCOPY;  Service:  Gastroenterology;  Laterality: N/A;   HERNIA REPAIR         Home Medications    Prior to Admission medications   Medication Sig Start Date End Date Taking? Authorizing Provider  azithromycin (ZITHROMAX Z-PAK) 250 MG tablet Take 2 tablets on day 1 then 1 tablet daily 08/27/23  Yes Malakie Balis, DO  escitalopram (LEXAPRO) 5 MG tablet Take 5 mg by mouth daily.   Yes [provider]  hydrochlorothiazide (MICROZIDE) 12.5 MG capsule Take 12.5 mg by mouth daily.   Yes [provider]  predniSONE (STERAPRED UNI-PAK 21 TAB) 10 MG (21) TBPK tablet Take by mouth daily. Take 6 tabs by mouth daily for 1, then 5 tabs for 1 day, then 4 tabs for 1 day, then 3 tabs for 1 day, then 2 tabs for 1 day, then 1 tab for 1 day. 08/27/23  Yes Kemaya Dorner, DO  albuterol (VENTOLIN HFA) 108 (90 Base) MCG/ACT inhaler Inhale 2 puffs into the lungs every 4 (four) hours as needed for wheezing or shortness of breath. 08/27/23   Howie Rufus, Seward Meth, DO  ibuprofen (ADVIL) 600 MG tablet Take 1 tablet (600 mg total) by mouth every 6 (six) hours as needed. 04/02/23   Domenick Gong, MD  ipratropium (ATROVENT) 0.03 % nasal spray Place 2 sprays into both nostrils every 12 (twelve) hours. 05/28/23   Katha Cabal, DO  lovastatin (MEVACOR) 40 MG tablet Take 1 tablet by mouth at  bedtime. 01/01/22 04/02/23  [provider]  methocarbamol (ROBAXIN) 500 MG tablet Take 500 mg by mouth every 8 (eight) hours as needed. 04/02/23   [provider]  omeprazole (PRILOSEC) 20 MG capsule Take 1 capsule (20 mg total) by mouth daily. 12/24/22   Rodriguez-Southworth, Nettie Elm, PA-C    Family History Family History  Problem Relation Age of Onset   Arthritis Mother    Hypertension Father     Social History Social History   Tobacco Use   Smoking status: Every Day    Current packs/day: 0.50    Types: Cigarettes   Smokeless tobacco: Never  Vaping Use   Vaping status: Never Used  Substance Use Topics    Alcohol use: Yes    Alcohol/week: 6.0 standard drinks of alcohol    Types: 6 Cans of beer per week    Comment: six pack of beer/day   Drug use: No     Allergies   Patient has no known allergies.   Review of Systems Review of Systems: negative unless otherwise stated in HPI.      Physical Exam Triage Vital Signs ED Triage Vitals [08/27/23 0825]  Encounter Vitals Group     BP      Systolic BP Percentile      Diastolic BP Percentile      Pulse      Resp      Temp      Temp src      SpO2      Weight      Height      Head Circumference      Peak Flow      Pain Score 0     Pain Loc      Pain Education      Exclude from Growth Chart    No data found.  Updated Vital Signs BP (!) 155/86 (BP Location: Left Arm)   Pulse 70   Temp 98.6 F (37 C) (Oral)   Resp 20   SpO2 97%   Visual Acuity Right Eye Distance:   Left Eye Distance:   Bilateral Distance:    Right Eye Near:   Left Eye Near:    Bilateral Near:     Physical Exam GEN:     alert, non-toxic appearing male in no distress    HENT:  mucus membranes moist, oropharyngeal without lesions or erythema, clear nasal discharge EYES:   no scleral injection or discharge NECK:  good ROM, no meningismus   RESP:  no increased work of breathing, scattered rhonchi with cough, no rales or wheezing  CVS:   regular rate and rhythm Skin:   warm and dry    UC Treatments / Results  Labs (all labs ordered are listed, but only abnormal results are displayed) Labs Reviewed - No data to display  EKG   Radiology DG Chest 2 View Result Date: 08/27/2023 CLINICAL DATA:  Cough. EXAM: CHEST - 2 VIEW COMPARISON:  April 02, 2023. FINDINGS: The heart size and mediastinal contours are within normal limits. Both lungs are clear. The visualized skeletal structures are unremarkable. IMPRESSION: No active cardiopulmonary disease. Electronically Signed   By: Lupita Raider M.D.   On: 08/27/2023 09:40    Procedures Procedures  (including critical care time)  Medications Ordered in UC Medications - No data to display  Initial Impression / Assessment and Plan / UC Course  I have reviewed the triage vital signs and the nursing notes.  Pertinent labs &  imaging results that were available during my care of the patient were reviewed by me and considered in my medical decision making (see chart for details).       Pt is a 57 y.o. male who presents for 1 month of cough that is not improving.  Fay is  afebrile here without recent antipyretics. Satting well on room air.   He is hypertensive here but took his BP medications just prior to arrival. Takes hydrochlorothiazide 12.5 mg.   Overall, pt is  non-toxic appearing, well hydrated, without respiratory distress. Pulmonary exam is remarkable for scattered rhonchi and cough.  After shared decision making, we will pursue chest x-ray.  COVID  and influenza testing deferred due to length of symptoms.   Chest xray personally reviewed by me without focal pneumonia, pleural effusion, cardiomegaly or pneumothorax. Patient aware the radiologist has not read his xray and is comfortable with the preliminary read by me. Will review radiologist read when available and call patient if a change in plan is warranted.  Pt agreeable to this plan prior to discharge.    Pt is a current cigarette smoker. Treat acute bronchitis with steroids and antibiotics as below.  Albuterol inhaler refilled.  Typical duration of symptoms discussed. Return and ED precautions given and patient voiced understanding.   Discussed MDM, treatment plan and plan for follow-up with patient who agrees with plan.    Radiologist impression reviewed.   Final Clinical Impressions(s) / UC Diagnoses   Final diagnoses:  Acute cough  Acute bronchitis, unspecified organism  Cigarette smoker     Discharge Instructions      Your chest xray did not show evidence of pneumonia though the radiologist has not yet  read it. If they find something that I didn't, I will call you.    Stop by the pharmacy to pick up your antibiotics and steroids.  Follow up with your primary care provider or return to the urgent care, if not improving.       ED Prescriptions     Medication Sig Dispense Auth. Provider   azithromycin (ZITHROMAX Z-PAK) 250 MG tablet Take 2 tablets on day 1 then 1 tablet daily 6 tablet Kaine Mcquillen, DO   predniSONE (STERAPRED UNI-PAK 21 TAB) 10 MG (21) TBPK tablet Take by mouth daily. Take 6 tabs by mouth daily for 1, then 5 tabs for 1 day, then 4 tabs for 1 day, then 3 tabs for 1 day, then 2 tabs for 1 day, then 1 tab for 1 day. 21 tablet Astra Gregg, DO   albuterol (VENTOLIN HFA) 108 (90 Base) MCG/ACT inhaler Inhale 2 puffs into the lungs every 4 (four) hours as needed for wheezing or shortness of breath. 18 g Katha Cabal, DO      PDMP not reviewed this encounter.   Katha Cabal, DO 08/27/23 [phone]

## 2023-09-25 ENCOUNTER — Ambulatory Visit: Payer: 59 | Admitting: Orthopedic Surgery

## 2023-09-26 NOTE — Progress Notes (Deleted)
Referring Physician:  No referring provider defined for this encounter.  Primary Physician:  Marisue Ivan, MD  History of Present Illness: 09/26/2023*** Tristan Thompson has a history of HTN, anxity, and alcohol dependence***.   History of ACDF C3-C7 by Dr. Gerlene Fee for myelopathy.   Last seen by Dr. Myer Haff on 05/27/23 for back and right groin/leg pain. MRI from October 2023 showed potential nerve impingement on the right side. However, the patient's symptoms have shifted from leg pain to predominantly back pain, making surgical intervention less likely to be beneficial.   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   He was referred to pain management to discuss possible lumbar injections. He has not seen them.    ***see pain management for facet injections?  ***further eval of groin pain?  ***if walking gets worse, update cervical imaging?   Duration: *** Location: *** Quality: *** Severity: ***  Precipitating: aggravated by *** Modifying factors: made better by *** Weakness: none Timing: *** Bowel/Bladder Dysfunction: none    Conservative measures:  Physical therapy: has participated at [facility] 02/2021. Stopped due to cost, but continued with home exercises.  Multimodal medical therapy including regular antiinflammatories: baclofen, ibuprofen, tylenol, aspirin  Injections:  has received epidural steroid injections 05/23/2022: Right L5-S1 and right S1 transforaminal ESI (no relief, Celestone 12 mg)  04/12/2022: Right L5-S1 and right S1 transforaminal ESI (temporary relief, dexamethasone 10 mg, Dr. Mariah Milling)    Past Surgery: 07/16/13: C3-7 ACDF by Dr. Gerlene Fee  The symptoms are causing a significant impact on the patient's life.   Review of Systems:  A 10 point review of systems is negative, except for the pertinent positives and negatives detailed in the HPI.  Past Medical History: Past Medical History:  Diagnosis Date   Anxiety     Chronic back pain    Hypertension    has a prescription but has not had it filled.   Umbilical hernia    has had surgery to "patch" it    Past Surgical History: Past Surgical History:  Procedure Laterality Date   ANTERIOR CERVICAL DECOMP/DISCECTOMY FUSION N/A 07/16/2013   Procedure: ANTERIOR CERVICAL DECOMPRESSION/DISCECTOMY FUSION CERVICAL THREE-FOUR,FOUR-FIVE AND CERVICAL SIX-SEVEN;  Surgeon: Reinaldo Meeker, MD;  Location: [facility];  Service: Neurosurgery;  Laterality: N/A;   BACK SURGERY     ESOPHAGOGASTRODUODENOSCOPY N/A 01/04/2017   Procedure: ESOPHAGOGASTRODUODENOSCOPY (EGD);  Surgeon: Toney Reil, MD;  Location: [facility] ENDOSCOPY;  Service: Gastroenterology;  Laterality: N/A;   HERNIA REPAIR      Allergies: Allergies as of 09/30/2023   (No Known Allergies)    Medications: Outpatient Encounter Medications as of 09/30/2023  Medication Sig   albuterol (VENTOLIN HFA) 108 (90 Base) MCG/ACT inhaler Inhale 2 puffs into the lungs every 4 (four) hours as needed for wheezing or shortness of breath.   azithromycin (ZITHROMAX Z-PAK) 250 MG tablet Take 2 tablets on day 1 then 1 tablet daily   escitalopram (LEXAPRO) 5 MG tablet Take 5 mg by mouth daily.   hydrochlorothiazide (MICROZIDE) 12.5 MG capsule Take 12.5 mg by mouth daily.   ibuprofen (ADVIL) 600 MG tablet Take 1 tablet (600 mg total) by mouth every 6 (six) hours as needed.   ipratropium (ATROVENT) 0.03 % nasal spray Place 2 sprays into both nostrils every 12 (twelve) hours.   lovastatin (MEVACOR) 40 MG tablet Take 1 tablet by mouth at bedtime.   methocarbamol (ROBAXIN) 500 MG tablet Take 500 mg by mouth every 8 (eight) hours as needed.  omeprazole (PRILOSEC) 20 MG capsule Take 1 capsule (20 mg total) by mouth daily.   predniSONE (STERAPRED UNI-PAK 21 TAB) 10 MG (21) TBPK tablet Take by mouth daily. Take 6 tabs by mouth daily for 1, then 5 tabs for 1 day, then 4 tabs for 1 day, then 3 tabs for 1 day, then 2 tabs for 1  day, then 1 tab for 1 day.   No facility-administered encounter medications on file as of 09/30/2023.    Social History: Social History   Tobacco Use   Smoking status: Every Day    Current packs/day: 0.50    Types: Cigarettes   Smokeless tobacco: Never  Vaping Use   Vaping status: Never Used  Substance Use Topics   Alcohol use: Yes    Alcohol/week: 6.0 standard drinks of alcohol    Types: 6 Cans of beer per week    Comment: six pack of beer/day   Drug use: No    Family Medical History: Family History  Problem Relation Age of Onset   Arthritis Mother    Hypertension Father     Physical Examination: There were no vitals filed for this visit.    Awake, alert, oriented to person, place, and time.  Speech is clear and fluent. Fund of knowledge is appropriate.   Cranial Nerves: Pupils equal round and reactive to light.  Facial tone is symmetric.    *** ROM of cervical spine *** pain *** posterior cervical tenderness. *** tenderness in bilateral trapezial region.   *** ROM of lumbar spine *** pain *** posterior lumbar tenderness.   No abnormal lesions on exposed skin.   Strength: Side Biceps Triceps Deltoid Interossei Grip Wrist Ext. Wrist Flex.  R 5 5 5 5 5 5 5   L 5 5 5 5 5 5 5    Side Iliopsoas Quads Hamstring PF DF EHL  R 5 5 5 5 5 5   L 5 5 5 5 5 5    Reflexes are ***2+ and symmetric at the biceps, brachioradialis, patella and achilles.   Hoffman's is absent.  Clonus is not present.   Bilateral upper and lower extremity sensation is intact to light touch.     Gait is normal.   ***No difficulty with tandem gait.    Medical Decision Making  Imaging: none   Assessment and Plan: [name] is a pleasant 58 y.o. male has ***  Treatment options discussed with patient and following plan made:   - Order for physical therapy for *** spine ***. Patient to call to schedule appointment. *** - Continue current medications including ***. Reviewed dosing and side  effects.  - Prescription for ***. Reviewed dosing and side effects. Take with food.  - Prescription for *** to take prn muscle spasms. Reviewed dosing and side effects. Discussed this can cause drowsiness.  - MRI of *** to further evaluate *** radiculopathy. No improvement time or medications (***).  - Referral to PMR at [facility] to discuss possible *** injections.  - Will schedule phone visit to review MRI results once I get them back.   I spent a total of *** minutes in face-to-face and non-face-to-face activities related to this patient's care today including review of outside records, review of imaging, review of symptoms, physical exam, discussion of differential diagnosis, discussion of treatment options, and documentation.   Thank you for involving me in the care of this patient.   Drake Leach PA-C Dept. of Neurosurgery

## 2023-09-27 ENCOUNTER — Encounter: Payer: Self-pay | Admitting: Emergency Medicine

## 2023-09-27 ENCOUNTER — Ambulatory Visit
Admission: EM | Admit: 2023-09-27 | Discharge: 2023-09-27 | Disposition: A | Discharge status: Home / Self Care | Payer: BC Managed Care – PPO | Attending: Family | Admitting: Family

## 2023-09-27 DIAGNOSIS — J0110 Acute frontal sinusitis, unspecified: Secondary | ICD-10-CM | POA: Diagnosis not present

## 2023-09-27 DIAGNOSIS — J3489 Other specified disorders of nose and nasal sinuses: Secondary | ICD-10-CM

## 2023-09-27 DIAGNOSIS — R051 Acute cough: Secondary | ICD-10-CM | POA: Diagnosis not present

## 2023-09-27 DIAGNOSIS — R052 Subacute cough: Secondary | ICD-10-CM

## 2023-09-27 MED ORDER — DOXYCYCLINE HYCLATE 100 MG PO CAPS: 100.0000 mg | ORAL_CAPSULE | Freq: Two times a day (BID) | ORAL | 0 refills | Status: AC

## 2023-09-27 NOTE — ED Provider Notes (Signed)
[facility]    CSN: [id number] Arrival date & time: 09/27/23  0801      History   Chief Complaint Chief Complaint  Patient presents with   Headache   Diarrhea    HPI Tristan Thompson is a 58 y.o. male.   58 year old male presents with sinus pressure, nasal congestion and persistent cough.   The history is provided by the patient.  Headache Associated symptoms: diarrhea   Diarrhea Associated symptoms: headaches     Past Medical History:  Diagnosis Date   Anxiety    Chronic back pain    Hypertension    has a prescription but has not had it filled.   Umbilical hernia    has had surgery to "patch" it    Patient Active Problem List   Diagnosis Date Noted   Alcohol dependence (HCC) 01/21/2017   Anxiety 01/21/2017   Chronic back pain 01/21/2017   Hypertension 01/21/2017   Seasonal allergies 01/21/2017    Past Surgical History:  Procedure Laterality Date   ANTERIOR CERVICAL DECOMP/DISCECTOMY FUSION N/A 07/16/2013   Procedure: ANTERIOR CERVICAL DECOMPRESSION/DISCECTOMY FUSION CERVICAL THREE-FOUR,FOUR-FIVE AND CERVICAL SIX-SEVEN;  Surgeon: Reinaldo Meeker, MD;  Location: [facility];  Service: Neurosurgery;  Laterality: N/A;   BACK SURGERY     ESOPHAGOGASTRODUODENOSCOPY N/A 01/04/2017   Procedure: ESOPHAGOGASTRODUODENOSCOPY (EGD);  Surgeon: Toney Reil, MD;  Location: [facility] ENDOSCOPY;  Service: Gastroenterology;  Laterality: N/A;   HERNIA REPAIR         Home Medications    Prior to Admission medications   Medication Sig Start Date End Date Taking? Authorizing Provider  doxycycline (VIBRAMYCIN) 100 MG capsule Take 1 capsule (100 mg total) by mouth 2 (two) times daily for 7 days. 09/27/23 10/04/23 Yes Delesia Martinek, Ali Lowe, NP  albuterol (VENTOLIN HFA) 108 (90 Base) MCG/ACT inhaler Inhale 2 puffs into the lungs every 4 (four) hours as needed for wheezing or shortness of breath. 08/27/23   Brimage, Seward Meth, DO  escitalopram (LEXAPRO) 5 MG tablet Take 5 mg  by mouth daily.    [provider]  hydrochlorothiazide (MICROZIDE) 12.5 MG capsule Take 12.5 mg by mouth daily.    [provider]  ipratropium (ATROVENT) 0.03 % nasal spray Place 2 sprays into both nostrils every 12 (twelve) hours. 05/28/23   Katha Cabal, DO  lovastatin (MEVACOR) 40 MG tablet Take 1 tablet by mouth at bedtime. 01/01/22 04/02/23  [provider]    Family History Family History  Problem Relation Age of Onset   Arthritis Mother    Hypertension Father     Social History Social History   Tobacco Use   Smoking status: Every Day    Current packs/day: 0.50    Types: Cigarettes   Smokeless tobacco: Never  Vaping Use   Vaping status: Never Used  Substance Use Topics   Alcohol use: Yes    Alcohol/week: 6.0 standard drinks of alcohol    Types: 6 Cans of beer per week    Comment: six pack of beer/day   Drug use: No     Allergies   Patient has no known allergies.   Review of Systems Review of Systems  Gastrointestinal:  Positive for diarrhea.  Neurological:  Positive for headaches.     Physical Exam Triage Vital Signs ED Triage Vitals  Encounter Vitals Group     BP 09/27/23 0810 (!) 164/81     Systolic BP Percentile --      Diastolic BP Percentile --  Pulse Rate 09/27/23 0810 67     Resp 09/27/23 0810 14     Temp 09/27/23 0810 97.8 F (36.6 C)     Temp Source 09/27/23 0810 Oral     SpO2 09/27/23 0810 99 %     Weight 09/27/23 0809 212 lb 1.3 oz (96.2 kg)     Height 09/27/23 0809 6\' 2"  (1.88 m)     Head Circumference --      Peak Flow --      Pain Score 09/27/23 0809 4     Pain Loc --      Pain Education --      Exclude from Growth Chart --    No data found.  Updated Vital Signs BP (!) 164/81 (BP Location: Left Arm) Comment: Patient has not taken his BP medicine today or yesterday  Pulse 67   Temp 97.8 F (36.6 C) (Oral)   Resp 14   Ht 6\' 2"  (1.88 m)   Wt 212 lb 1.3 oz (96.2 kg)   SpO2 99%   BMI 27.23  kg/m   Visual Acuity Right Eye Distance:   Left Eye Distance:   Bilateral Distance:    Right Eye Near:   Left Eye Near:    Bilateral Near:     Physical Exam Vitals and nursing note reviewed.  Constitutional:      Appearance: He is ill-appearing.  HENT:     Head: Normocephalic and atraumatic.     Nose: Congestion present.     Right Sinus: Frontal sinus tenderness present. No maxillary sinus tenderness.     Left Sinus: Frontal sinus tenderness present. No maxillary sinus tenderness.  Cardiovascular:     Rate and Rhythm: Normal rate and regular rhythm.     Heart sounds: Normal heart sounds. No murmur heard. Pulmonary:     Effort: Pulmonary effort is normal.     Breath sounds: Normal breath sounds and air entry. No decreased air movement. No decreased breath sounds, wheezing, rhonchi or rales.  Neurological:     Mental Status: He is alert.      UC Treatments / Results  Labs (all labs ordered are listed, but only abnormal results are displayed) Labs Reviewed - No data to display  EKG   Radiology No results found.  Procedures Procedures (including critical care time)  Medications Ordered in UC Medications - No data to display  Initial Impression / Assessment and Plan / UC Course  I have reviewed the triage vital signs and the nursing notes.  Pertinent labs & imaging results that were available during my care of the patient were reviewed by me and considered in my medical decision making (see chart for details).     *** Final Clinical Impressions(s) / UC Diagnoses   Final diagnoses:  Acute non-recurrent frontal sinusitis  Acute cough  Sinus pressure     Discharge Instructions      Recommend start Doxycycline 100mg  twice a day with food for 7 days. Continue to push fluids to help loosen up mucus in sinuses and chest. May take OTC Alka Seltzer as directed for congestion and cough. May continue Albuterol inhaler 2 puffs every 6 hours as needed for cough.  Encouraged to take blood pressure medication every day. Discussed if cough does not improve within the next 2 weeks, you should go to your PCP for further evaluation. Recommend follow-up with your PCP in 4 to 5 days if not improving.      ED Prescriptions  Medication Sig Dispense Auth. Provider   doxycycline (VIBRAMYCIN) 100 MG capsule Take 1 capsule (100 mg total) by mouth 2 (two) times daily for 7 days. 14 capsule Kaysee Hergert, Ali Lowe, NP      PDMP not reviewed this encounter.

## 2023-09-27 NOTE — Discharge Instructions (Signed)
Recommend start Doxycycline 100mg  twice a day with food for 7 days. Continue to push fluids to help loosen up mucus in sinuses and chest. May take OTC Alka Seltzer as directed for congestion and cough. May continue Albuterol inhaler 2 puffs every 6 hours as needed for cough. Encouraged to take blood pressure medication every day. Discussed if cough does not improve within the next 2 weeks, you should go to your PCP for further evaluation.

## 2023-09-27 NOTE — ED Triage Notes (Signed)
Patient reports congestion, runny nose, and sinus pressure that started a week ago. Patient reports diarrhea for the past 2-3 days.  Patient unsure of fevers.

## 2023-09-30 ENCOUNTER — Ambulatory Visit: Payer: BC Managed Care – PPO | Admitting: Orthopedic Surgery

## 2023-10-14 ENCOUNTER — Encounter: Payer: Self-pay | Admitting: Orthopedic Surgery

## 2024-04-23 ENCOUNTER — Encounter: Payer: Self-pay | Admitting: Emergency Medicine

## 2024-04-23 ENCOUNTER — Ambulatory Visit
Admission: EM | Admit: 2024-04-23 | Discharge: 2024-04-23 | Disposition: A | Discharge status: Home / Self Care | Attending: Emergency Medicine | Admitting: Emergency Medicine

## 2024-04-23 DIAGNOSIS — J069 Acute upper respiratory infection, unspecified: Secondary | ICD-10-CM | POA: Diagnosis present

## 2024-04-23 DIAGNOSIS — F17200 Nicotine dependence, unspecified, uncomplicated: Secondary | ICD-10-CM | POA: Diagnosis present

## 2024-04-23 DIAGNOSIS — H9202 Otalgia, left ear: Secondary | ICD-10-CM

## 2024-04-23 LAB — GROUP A STREP BY PCR: Group A Strep by PCR: NOT DETECTED

## 2024-04-23 LAB — SARS CORONAVIRUS 2 BY RT PCR: SARS Coronavirus 2 by RT PCR: NEGATIVE

## 2024-04-23 NOTE — ED Triage Notes (Signed)
 Patient c/o cough, runny nose and sinus congestion that started 2 days ago.  Patient reports left ear pain that started yesterday. Patient unsure of fevers.

## 2024-04-23 NOTE — Discharge Instructions (Signed)
 Your COVID and strep test are both negative , most likely you have a viral illness: no antibiotic is indicated at this time, May treat with OTC meds of choice(mucinex,robitussin, etc). Make sure to drink plenty of fluids to stay hydrated(gatorade, water, popsicles,jello,etc), avoid caffeine products. Follow up with PCP.   If you develop chest pain,shortness of breath or worsening symptoms go to ER for further evaluation.

## 2024-04-23 NOTE — ED Provider Notes (Signed)
 [facility]    CSN: [id number] Arrival date & time: 04/23/24  1308      History   Chief Complaint Chief Complaint  Patient presents with   Otalgia   Cough   Nasal Congestion    HPI Tristan Thompson is a 58 y.o. male.   58 year old male patient, Tristan Thompson, presents to urgent care for evaluation of cough runny nose and sinus congestion that started 2 days ago patient also complaining of left ear pain, no known fever, unknown illness exposure.  Pmh: HTN, cigarette smoker  The history is provided by the patient. No language interpreter was used.    Past Medical History:  Diagnosis Date   Anxiety    Chronic back pain    Hypertension    has a prescription but has not had it filled.   Umbilical hernia    has had surgery to patch it    Patient Active Problem List   Diagnosis Date Noted   Left ear pain 04/23/2024   Viral URI with cough 04/23/2024   Smoker 04/23/2024   Alcohol dependence (HCC) 01/21/2017   Anxiety 01/21/2017   Chronic back pain 01/21/2017   Hypertension 01/21/2017   Seasonal allergies 01/21/2017    Past Surgical History:  Procedure Laterality Date   ANTERIOR CERVICAL DECOMP/DISCECTOMY FUSION N/A 07/16/2013   Procedure: ANTERIOR CERVICAL DECOMPRESSION/DISCECTOMY FUSION CERVICAL THREE-FOUR,FOUR-FIVE AND CERVICAL SIX-SEVEN;  Surgeon: Darina MALVA Boehringer, MD;  Location: [facility];  Service: Neurosurgery;  Laterality: N/A;   BACK SURGERY     ESOPHAGOGASTRODUODENOSCOPY N/A 01/04/2017   Procedure: ESOPHAGOGASTRODUODENOSCOPY (EGD);  Surgeon: Corinn Jess Brooklyn, MD;  Location: [facility] ENDOSCOPY;  Service: Gastroenterology;  Laterality: N/A;   HERNIA REPAIR         Home Medications    Prior to Admission medications   Medication Sig Start Date End Date Taking? Authorizing Provider  tiZANidine (ZANAFLEX) 4 MG tablet 1/2-1 tid prn 01/27/24  Yes [provider]  albuterol  (VENTOLIN  HFA) 108 (90 Base) MCG/ACT inhaler Inhale 2 puffs into  the lungs every 4 (four) hours as needed for wheezing or shortness of breath. 08/27/23   Brimage, Vondra, DO  escitalopram (LEXAPRO) 5 MG tablet Take 5 mg by mouth daily.    [provider]  hydrochlorothiazide (MICROZIDE) 12.5 MG capsule Take 12.5 mg by mouth daily.    [provider]  ipratropium (ATROVENT ) 0.03 % nasal spray Place 2 sprays into both nostrils every 12 (twelve) hours. 05/28/23   Brimage, Vondra, DO  lovastatin (MEVACOR) 40 MG tablet Take 1 tablet by mouth at bedtime. 01/01/22 04/02/23  [provider]    Family History Family History  Problem Relation Age of Onset   Arthritis Mother    Hypertension Father     Social History Social History   Tobacco Use   Smoking status: Every Day    Current packs/day: 0.50    Types: Cigarettes   Smokeless tobacco: Never  Vaping Use   Vaping status: Never Used  Substance Use Topics   Alcohol use: Yes    Alcohol/week: 6.0 standard drinks of alcohol    Types: 6 Cans of beer per week    Comment: six pack of beer/day   Drug use: No     Allergies   Patient has no known allergies.   Review of Systems Review of Systems  Constitutional:  Negative for fever.  HENT:  Positive for congestion, rhinorrhea and sinus pressure.   Respiratory:  Positive for cough.   All  other systems reviewed and are negative.    Physical Exam Triage Vital Signs ED Triage Vitals  Encounter Vitals Group     BP 04/23/24 1323 (!) 161/89     Girls Systolic BP Percentile --      Girls Diastolic BP Percentile --      Boys Systolic BP Percentile --      Boys Diastolic BP Percentile --      Pulse Rate 04/23/24 1323 63     Resp 04/23/24 1323 16     Temp 04/23/24 1323 98.2 F (36.8 C)     Temp Source 04/23/24 1323 Oral     SpO2 04/23/24 1323 96 %     Weight 04/23/24 1320 212 lb 1.3 oz (96.2 kg)     Height 04/23/24 1320 6' 2 (1.88 m)     Head Circumference --      Peak Flow --      Pain Score 04/23/24 1320 5     Pain Loc  --      Pain Education --      Exclude from Growth Chart --    No data found.  Updated Vital Signs BP (!) 161/89 (BP Location: Right Arm)   Pulse 63   Temp 98.2 F (36.8 C) (Oral)   Resp 16   Ht 6' 2 (1.88 m)   Wt 212 lb 1.3 oz (96.2 kg)   SpO2 96%   BMI 27.23 kg/m   Visual Acuity Right Eye Distance:   Left Eye Distance:   Bilateral Distance:    Right Eye Near:   Left Eye Near:    Bilateral Near:     Physical Exam Vitals and nursing note reviewed.  Constitutional:      General: He is not in acute distress.    Appearance: He is well-developed. He is not ill-appearing or toxic-appearing.  HENT:     Head: Normocephalic.     Right Ear: Tympanic membrane is retracted.     Left Ear: Tympanic membrane is retracted.     Nose: Mucosal edema and congestion present.     Mouth/Throat:     Lips: Pink.     Mouth: Mucous membranes are moist.     Pharynx: Oropharynx is clear. Uvula midline.  Eyes:     General: Lids are normal.     Conjunctiva/sclera: Conjunctivae normal.     Pupils: Pupils are equal, round, and reactive to light.  Cardiovascular:     Rate and Rhythm: Normal rate and regular rhythm.     Heart sounds: Normal heart sounds.  Pulmonary:     Effort: Pulmonary effort is normal. No respiratory distress.     Breath sounds: Normal breath sounds and air entry. No decreased breath sounds or wheezing.  Abdominal:     General: There is no distension.     Palpations: Abdomen is soft.  Musculoskeletal:        General: Normal range of motion.     Cervical back: Normal range of motion.  Skin:    General: Skin is warm and dry.     Findings: No rash.  Neurological:     General: No focal deficit present.     Mental Status: He is alert and oriented to person, place, and time.     GCS: GCS eye subscore is 4. GCS verbal subscore is 5. GCS motor subscore is 6.     Cranial Nerves: No cranial nerve deficit.     Sensory: No sensory deficit.  Psychiatric:  Speech: Speech  normal.        Behavior: Behavior normal. Behavior is cooperative.      UC Treatments / Results  Labs (all labs ordered are listed, but only abnormal results are displayed) Labs Reviewed  SARS CORONAVIRUS 2 BY RT PCR  GROUP A STREP BY PCR    EKG   Radiology No results found.  Procedures Procedures (including critical care time)  Medications Ordered in UC Medications - No data to display  Initial Impression / Assessment and Plan / UC Course  I have reviewed the triage vital signs and the nursing notes.  Pertinent labs & imaging results that were available during my care of the patient were reviewed by me and considered in my medical decision making (see chart for details).  Clinical Course as of 04/23/24 2034  Fri Apr 23, 2024  1410 Covid and strep are both negative, most likely you have a viral illness, may take over-the-counter meds for symptom management, stop smoking, follow-up with your PCP [JD]    Clinical Course User Index [JD] Nareh Matzke, Rilla, NP  Discussed exam findings and plan of care with patient, strict go to ER precautions given.   Patient verbalized understanding to this provider.  Ddx: Viral URI with cough, left otalgia, smoker Final Clinical Impressions(s) / UC Diagnoses   Final diagnoses:  Left ear pain  Viral URI with cough  Smoker     Discharge Instructions      Your COVID and strep test are both negative , most likely you have a viral illness: no antibiotic is indicated at this time, May treat with OTC meds of choice(mucinex,robitussin, etc). Make sure to drink plenty of fluids to stay hydrated(gatorade, water, popsicles,jello,etc), avoid caffeine products. Follow up with PCP.   If you develop chest pain,shortness of breath or worsening symptoms go to ER for further evaluation.     ED Prescriptions   None    PDMP not reviewed this encounter.   Aminta Rilla, NP 04/23/24 2034

## 2024-06-17 ENCOUNTER — Ambulatory Visit
Admission: EM | Admit: 2024-06-17 | Discharge: 2024-06-17 | Disposition: A | Discharge status: Home / Self Care | Attending: Family Medicine | Admitting: Family Medicine

## 2024-06-17 DIAGNOSIS — S39012A Strain of muscle, fascia and tendon of lower back, initial encounter: Secondary | ICD-10-CM

## 2024-06-17 DIAGNOSIS — I10 Essential (primary) hypertension: Secondary | ICD-10-CM

## 2024-06-17 MED ORDER — KETOROLAC TROMETHAMINE 60 MG/2ML IM SOLN
30.0000 mg | Freq: Once | INTRAMUSCULAR | Status: AC
  Administered 2024-06-17: 30 mg via INTRAMUSCULAR

## 2024-06-17 MED ORDER — LIDOCAINE 5 % EX PTCH: 1.0000 | MEDICATED_PATCH | CUTANEOUS | 0 refills | Status: AC

## 2024-06-17 MED ORDER — PREDNISONE 10 MG (21) PO TBPK: ORAL_TABLET | Freq: Every day | ORAL | 0 refills | Status: DC

## 2024-06-17 NOTE — ED Triage Notes (Signed)
 Pt c/o back pain x2-3days  Pt has sciatica   Pt denies new physical activity   Pt states that he did lift plywood and load it onto his truck but it was not much  Pt states the pain is in his lower right back and described as sharp, stabbing, and pinching  Pt states that he was standing in a wicker chair 1 week ago and it broke and he fell backwards onto his back and hurt his neck. Pt states that he was just sore.

## 2024-06-17 NOTE — ED Provider Notes (Signed)
 [facility]    CSN: [id number] Arrival date & time: 06/17/24  1016      History   Chief Complaint Chief Complaint  Patient presents with   Back Pain    HPI  HPI Tristan Thompson is a 58 y.o. male.   Tristan Thompson presents for left low back pain that started 2-3 days ago after moving plywood over the weekend. He fell backwards into a chair on the front porch.  Took tizanidine without relief. Pain is described as sharp and pinching and does  not radiate. Pain rated 9/10.  Endorses weakness, numbness, tingling, dysuria, leg pain, abdominal pain, chest pain, incontinence, pelvic pain, perianal numbness.   Continues to have pain with movement. Tristan Thompson does not feel like his legs are weak.   Has history of sciatica.          Past Medical History:  Diagnosis Date   Anxiety    Chronic back pain    Hypertension    has a prescription but has not had it filled.   Umbilical hernia    has had surgery to patch it    Patient Active Problem List   Diagnosis Date Noted   Left ear pain 04/23/2024   Viral URI with cough 04/23/2024   Smoker 04/23/2024   Alcohol dependence (HCC) 01/21/2017   Anxiety 01/21/2017   Chronic back pain 01/21/2017   Hypertension 01/21/2017   Seasonal allergies 01/21/2017    Past Surgical History:  Procedure Laterality Date   ANTERIOR CERVICAL DECOMP/DISCECTOMY FUSION N/A 07/16/2013   Procedure: ANTERIOR CERVICAL DECOMPRESSION/DISCECTOMY FUSION CERVICAL THREE-FOUR,FOUR-FIVE AND CERVICAL SIX-SEVEN;  Surgeon: Darina MALVA Boehringer, MD;  Location: [facility];  Service: Neurosurgery;  Laterality: N/A;   BACK SURGERY     ESOPHAGOGASTRODUODENOSCOPY N/A 01/04/2017   Procedure: ESOPHAGOGASTRODUODENOSCOPY (EGD);  Surgeon: Corinn Jess Brooklyn, MD;  Location: [facility] ENDOSCOPY;  Service: Gastroenterology;  Laterality: N/A;   HERNIA REPAIR         Home Medications    Prior to Admission medications   Medication Sig Start Date End Date Taking? Authorizing Provider   albuterol  (VENTOLIN  HFA) 108 (90 Base) MCG/ACT inhaler Inhale 2 puffs into the lungs every 4 (four) hours as needed for wheezing or shortness of breath. 08/27/23  Yes Cydnie Deason, DO  escitalopram (LEXAPRO) 5 MG tablet Take 5 mg by mouth daily.   Yes [provider]  hydrochlorothiazide (MICROZIDE) 12.5 MG capsule Take 12.5 mg by mouth daily.   Yes [provider]  ipratropium (ATROVENT ) 0.03 % nasal spray Place 2 sprays into both nostrils every 12 (twelve) hours. 05/28/23  Yes Apollos Tenbrink, DO  lidocaine  (LIDODERM ) 5 % Place 1 patch onto the skin daily. Remove & Discard patch within 12 hours or as directed by MD 06/17/24  Yes Konstance Happel, Caprice, DO  predniSONE  (STERAPRED UNI-PAK 21 TAB) 10 MG (21) TBPK tablet Take by mouth daily. Take 6 tabs by mouth daily for 1, then 5 tabs for 1 day, then 4 tabs for 1 day, then 3 tabs for 1 day, then 2 tabs for 1 day, then 1 tab for 1 day. 06/17/24  Yes Breylon Sherrow, DO  tiZANidine (ZANAFLEX) 4 MG tablet 1/2-1 tid prn 01/27/24  Yes [provider]  lovastatin (MEVACOR) 40 MG tablet Take 1 tablet by mouth at bedtime. 01/01/22 04/02/23  [provider]    Family History Family History  Problem Relation Age of Onset   Arthritis Mother    Hypertension Father     Social  History Social History   Tobacco Use   Smoking status: Every Day    Current packs/day: 0.50    Types: Cigarettes   Smokeless tobacco: Never  Vaping Use   Vaping status: Never Used  Substance Use Topics   Alcohol use: Yes    Alcohol/week: 6.0 standard drinks of alcohol    Types: 6 Cans of beer per week    Comment: six pack of beer/day   Drug use: No     Allergies   Patient has no known allergies.   Review of Systems Review of Systems: egative unless otherwise stated in HPI.      Physical Exam Triage Vital Signs ED Triage Vitals  Encounter Vitals Group     BP 06/17/24 1045 (!) 171/89     Girls Systolic BP Percentile --      Girls  Diastolic BP Percentile --      Boys Systolic BP Percentile --      Boys Diastolic BP Percentile --      Pulse Rate 06/17/24 1045 (!) 56     Resp --      Temp 06/17/24 1045 98 F (36.7 C)     Temp Source 06/17/24 1045 Oral     SpO2 06/17/24 1045 97 %     Weight 06/17/24 1042 222 lb 3.2 oz (100.8 kg)     Height --      Head Circumference --      Peak Flow --      Pain Score 06/17/24 1042 9     Pain Loc --      Pain Education --      Exclude from Growth Chart --    No data found.  Updated Vital Signs BP (!) 160/85 (BP Location: Left Arm)   Pulse (!) 53   Temp 98 F (36.7 C) (Oral)   Wt 100.8 kg   SpO2 99%   BMI 28.53 kg/m   Visual Acuity Right Eye Distance:   Left Eye Distance:   Bilateral Distance:    Right Eye Near:   Left Eye Near:    Bilateral Near:     Physical Exam GEN: well appearing male in no acute distress  CVS: well perfused  RESP: speaking in full sentences without pause, no respiratory distress  MSK:  Thoracic and Lumbar Spine: - Inspection: no gross deformity or asymmetry, swelling or ecchymosis. No skin changes  - Palpation: + TTP over the lumbar spinous processes, left lumbar paraspinal muscle tenderness and hypertonicity, no SI joint tenderness bilaterally - ROM: full active ROM of the lumbar spine in flexion and extension, good thoracic rotation   - Strength: 5/5 strength of lower extremity in L4-S1 nerve root distributions b/l - Neuro: sensation intact in the L4-S1 nerve root distribution b/l - Special testing: Negative straight leg raise SKIN: warm, dry, no overly skin rash or erythema    UC Treatments / Results  Labs (all labs ordered are listed, but only abnormal results are displayed) Labs Reviewed - No data to display  EKG   Radiology No results found.   Procedures Procedures (including critical care time)  Medications Ordered in UC Medications  ketorolac  (TORADOL ) injection 30 mg (30 mg Intramuscular Given 06/17/24 1125)     Initial Impression / Assessment and Plan / UC Course  I have reviewed the triage vital signs and the nursing notes.  Pertinent labs & imaging results that were available during my care of the patient were reviewed by me and considered  in my medical decision making (see chart for details).      Pt is a 57 y.o.  male with 2-3  days of left lower  back pain after moving plywood.  Has history of low back pain.   On chart review, previous MR lumbar imaging from October 2023 showed L2-L3 mild spinal canal stenosis with mild right neuroforaminal narrowing, L3-L4 and L4-S5 mild bilateral neuroforaminal narrowing, L5-S1 mild right neuroforaminal foraminal narrowing.   On exam, he has left-sided muscular tenderness to palpation with hypertonicity with midline spinal process tenderness. Offered lumbar imaging today but he declined.  Suspect acute on chronic back pain flareup.  Discussed IM versus p.o. pain control and he request IM pain control here.  Given Toradol  30 mg IM.  Serum creatinine was normal back in July 2025.  Patient to gradually return to normal activities, as tolerated and continue ordinary activities within the limits permitted by pain. Prescribed lidocaine  patches and prednisone  taper for pain.  He has home muscle relaxers to use.  Tylenol  for multimodal pain relief. Counseled patient on red flag symptoms and when to seek immediate care.  No red flags suggesting cauda equina syndrome or progressive major motor weakness. Patient to follow up with orthopedic provider if symptoms do not improve with conservative treatment.  Return and ED precautions given.    Discussed MDM, treatment plan and plan for follow-up with patient who agrees with plan.   Final Clinical Impressions(s) / UC Diagnoses   Final diagnoses:  Strain of lumbar region, initial encounter     Discharge Instructions      If medication was prescribed, stop by the pharmacy to pick up your prescriptions.  For your back  pain, Take 1500 mg Tylenol  twice a day, take muscle relaxer  as needed for pain. Can take Motrin  400 mg every 6 hours. If your insurance doesn't cover the lidocaine  patches, consider stopping by the pharmacy or dollar store to pick up some Lidocaine  patches. Apply for 12 hours and then remove.   Watch for worsening symptoms such as an increasing weakness or loss of sensation in your arms or legs, increasing pain and/or the loss of bladder or bowel function. Should any of these occur, go to the emergency department immediately.       ED Prescriptions     Medication Sig Dispense Auth. Provider   predniSONE  (STERAPRED UNI-PAK 21 TAB) 10 MG (21) TBPK tablet Take by mouth daily. Take 6 tabs by mouth daily for 1, then 5 tabs for 1 day, then 4 tabs for 1 day, then 3 tabs for 1 day, then 2 tabs for 1 day, then 1 tab for 1 day. 21 tablet Henny Strauch, DO   lidocaine  (LIDODERM ) 5 % Place 1 patch onto the skin daily. Remove & Discard patch within 12 hours or as directed by MD 30 patch Natassia Guthridge, DO      I have reviewed the PDMP during this encounter.   Kalany Diekmann, DO 06/17/24 1958

## 2024-06-17 NOTE — Discharge Instructions (Addendum)
 If medication was prescribed, stop by the pharmacy to pick up your prescriptions.  For your back pain, Take 1500 mg Tylenol  twice a day, take muscle relaxer  as needed for pain. Can take Motrin  400 mg every 6 hours. If your insurance doesn't cover the lidocaine  patches, consider stopping by the pharmacy or dollar store to pick up some Lidocaine  patches. Apply for 12 hours and then remove.   Watch for worsening symptoms such as an increasing weakness or loss of sensation in your arms or legs, increasing pain and/or the loss of bladder or bowel function. Should any of these occur, go to the emergency department immediately.

## 2024-06-18 ENCOUNTER — Other Ambulatory Visit: Payer: Self-pay

## 2024-06-18 ENCOUNTER — Emergency Department
Admission: EM | Admit: 2024-06-18 | Discharge: 2024-06-18 | Disposition: A | Discharge status: Home / Self Care | Attending: Emergency Medicine | Admitting: Emergency Medicine

## 2024-06-18 ENCOUNTER — Ambulatory Visit: Admit: 2024-06-18 | Admitting: Internal Medicine

## 2024-06-18 ENCOUNTER — Emergency Department: Admitting: Anesthesiology

## 2024-06-18 ENCOUNTER — Encounter
Admission: EM | Disposition: A | Discharge status: Home / Self Care | Payer: Self-pay | Source: Home / Self Care | Attending: Emergency Medicine

## 2024-06-18 ENCOUNTER — Emergency Department

## 2024-06-18 DIAGNOSIS — K269 Duodenal ulcer, unspecified as acute or chronic, without hemorrhage or perforation: Secondary | ICD-10-CM | POA: Insufficient documentation

## 2024-06-18 DIAGNOSIS — W448XXA Other foreign body entering into or through a natural orifice, initial encounter: Secondary | ICD-10-CM | POA: Insufficient documentation

## 2024-06-18 DIAGNOSIS — K3189 Other diseases of stomach and duodenum: Secondary | ICD-10-CM | POA: Insufficient documentation

## 2024-06-18 DIAGNOSIS — K2950 Unspecified chronic gastritis without bleeding: Secondary | ICD-10-CM | POA: Diagnosis not present

## 2024-06-18 DIAGNOSIS — T18128A Food in esophagus causing other injury, initial encounter: Secondary | ICD-10-CM | POA: Diagnosis present

## 2024-06-18 DIAGNOSIS — W44F3XA Food entering into or through a natural orifice, initial encounter: Secondary | ICD-10-CM | POA: Diagnosis not present

## 2024-06-18 DIAGNOSIS — F17210 Nicotine dependence, cigarettes, uncomplicated: Secondary | ICD-10-CM | POA: Insufficient documentation

## 2024-06-18 DIAGNOSIS — K2980 Duodenitis without bleeding: Secondary | ICD-10-CM | POA: Insufficient documentation

## 2024-06-18 DIAGNOSIS — I10 Essential (primary) hypertension: Secondary | ICD-10-CM | POA: Insufficient documentation

## 2024-06-18 LAB — BASIC METABOLIC PANEL WITH GFR
Anion gap: 16 — ABNORMAL HIGH (ref 5–15)
BUN: 12 mg/dL (ref 6–20)
CO2: 24 mmol/L (ref 22–32)
Calcium: 8.9 mg/dL (ref 8.9–10.3)
Chloride: 99 mmol/L (ref 98–111)
Creatinine, Ser: 0.96 mg/dL (ref 0.61–1.24)
GFR, Estimated: >60 mL/min (ref >60)
Glucose, Bld: 103 mg/dL — ABNORMAL HIGH (ref 70–99)
Potassium: 4.2 mmol/L (ref 3.5–5.1)
Sodium: 139 mmol/L (ref 135–145)

## 2024-06-18 LAB — CBC
HCT: 43 % (ref 39.0–52.0)
Hemoglobin: 14.7 g/dL (ref 13.0–17.0)
MCH: 31.7 pg (ref 26.0–34.0)
MCHC: 34.2 g/dL (ref 30.0–36.0)
MCV: 92.7 fL (ref 80.0–100.0)
Platelets: 179 K/uL (ref 150–400)
RBC: 4.64 MIL/uL (ref 4.22–5.81)
RDW: 12.4 % (ref 11.5–15.5)
WBC: 6.7 K/uL (ref 4.0–10.5)
nRBC: 0 % (ref 0.0–0.2)

## 2024-06-18 SURGERY — EGD (ESOPHAGOGASTRODUODENOSCOPY)
Anesthesia: General

## 2024-06-18 MED ORDER — FENTANYL CITRATE (PF) 100 MCG/2ML IJ SOLN
INTRAMUSCULAR | Status: DC | PRN
  Administered 2024-06-18: 50 ug via INTRAVENOUS

## 2024-06-18 MED ORDER — PROPOFOL 10 MG/ML IV BOLUS
INTRAVENOUS | Status: AC
  Filled 2024-06-18: qty 20

## 2024-06-18 MED ORDER — GLUCAGON HCL RDNA (DIAGNOSTIC) 1 MG IJ SOLR
1.0000 mg | Freq: Once | INTRAMUSCULAR | Status: AC | PRN
  Administered 2024-06-18: 1 mg via INTRAVENOUS
  Filled 2024-06-18: qty 1

## 2024-06-18 MED ORDER — EPHEDRINE SULFATE-NACL 50-0.9 MG/10ML-% IV SOSY
PREFILLED_SYRINGE | INTRAVENOUS | Status: DC | PRN
  Administered 2024-06-18: 10 mg via INTRAVENOUS

## 2024-06-18 MED ORDER — ONDANSETRON HCL 4 MG/2ML IJ SOLN
INTRAMUSCULAR | Status: DC | PRN
  Administered 2024-06-18: 4 mg via INTRAVENOUS

## 2024-06-18 MED ORDER — LIDOCAINE HCL (CARDIAC) PF 100 MG/5ML IV SOSY
PREFILLED_SYRINGE | INTRAVENOUS | Status: DC | PRN
  Administered 2024-06-18: 100 mg via INTRAVENOUS

## 2024-06-18 MED ORDER — DEXAMETHASONE SODIUM PHOSPHATE 4 MG/ML IJ SOLN
INTRAMUSCULAR | Status: DC | PRN
  Administered 2024-06-18: 10 mg via INTRAVENOUS

## 2024-06-18 MED ORDER — SODIUM CHLORIDE 0.9 % IV SOLN: INTRAVENOUS | Status: DC

## 2024-06-18 MED ORDER — PROPOFOL 10 MG/ML IV BOLUS
INTRAVENOUS | Status: DC | PRN
  Administered 2024-06-18: 100 mg via INTRAVENOUS
  Administered 2024-06-18: 200 mg via INTRAVENOUS

## 2024-06-18 MED ORDER — FENTANYL CITRATE (PF) 100 MCG/2ML IJ SOLN
INTRAMUSCULAR | Status: AC
  Filled 2024-06-18: qty 2

## 2024-06-18 MED ORDER — SUCCINYLCHOLINE CHLORIDE 200 MG/10ML IV SOSY
PREFILLED_SYRINGE | INTRAVENOUS | Status: DC | PRN
  Administered 2024-06-18: 140 mg via INTRAVENOUS

## 2024-06-18 NOTE — Anesthesia Preprocedure Evaluation (Signed)
 Anesthesia Evaluation  Patient identified by MRN, date of birth, ID band Patient awake    Reviewed: Allergy & Precautions, H&P , NPO status , Patient's Chart, lab work & pertinent test results  Airway Mallampati: II  TM Distance: >3 FB Neck ROM: full    Dental no notable dental hx.    Pulmonary Current Smoker and Patient abstained from smoking.   Pulmonary exam normal        Cardiovascular hypertension, Normal cardiovascular exam     Neuro/Psych  PSYCHIATRIC DISORDERS Anxiety     negative neurological ROS     GI/Hepatic negative GI ROS,,,(+)     substance abuse  alcohol use  Endo/Other  negative endocrine ROS    Renal/GU      Musculoskeletal   Abdominal Normal abdominal exam  (+)   Peds  Hematology negative hematology ROS (+)   Anesthesia Other Findings Past Medical History: No date: Anxiety No date: Chronic back pain No date: Hypertension     Comment:  has a prescription but has not had it filled. No date: Umbilical hernia     Comment:  has had surgery to patch it  Past Surgical History: 07/16/2013: ANTERIOR CERVICAL DECOMP/DISCECTOMY FUSION; N/A     Comment:  Procedure: ANTERIOR CERVICAL DECOMPRESSION/DISCECTOMY               FUSION CERVICAL THREE-FOUR,FOUR-FIVE AND CERVICAL               SIX-SEVEN;  Surgeon: Darina MALVA Boehringer, MD;  Location: [facility];  Service: Neurosurgery;  Laterality: N/A; No date: BACK SURGERY 01/04/2017: ESOPHAGOGASTRODUODENOSCOPY; N/A     Comment:  Procedure: ESOPHAGOGASTRODUODENOSCOPY (EGD);  Surgeon:               Corinn Jess Brooklyn, MD;  Location: [facility] ENDOSCOPY;                Service: Gastroenterology;  Laterality: N/A; No date: HERNIA REPAIR  BMI    Body Mass Index: 28.25 kg/m      Reproductive/Obstetrics negative OB ROS                              Anesthesia Physical Anesthesia Plan  ASA: 2  Anesthesia Plan: General  ETT   Post-op Pain Management: Minimal or no pain anticipated   Induction: Intravenous and Rapid sequence  PONV Risk Score and Plan: 2 and Ondansetron , Dexamethasone  and Midazolam   Airway Management Planned: Oral ETT  Additional Equipment:   Intra-op Plan:   Post-operative Plan: Extubation in OR  Informed Consent: I have reviewed the patients History and Physical, chart, labs and discussed the procedure including the risks, benefits and alternatives for the proposed anesthesia with the patient or authorized representative who has indicated his/her understanding and acceptance.     Dental Advisory Given  Plan Discussed with: CRNA and Surgeon  Anesthesia Plan Comments:          Anesthesia Quick Evaluation

## 2024-06-18 NOTE — H&P (View-Only) (Signed)
 [facility] GI Inpatient Consult Note   Tristan Thompson, M.D.  Reason for Consult: Food impaction in the esophagus, persistent dysphagia   Attending Requesting Consult: Lamar Price, MD  History of Present Illness: Tristan Thompson is a 58 y.o. male with a history of poor medical follow-up presents to the emergency room this morning with approximately 12 hours of inability to swallow.  Patient ate [employer] chicken dinner last night and was very hungry therefore ate very fast.  He was unable to swallow a large bite of chicken.  He tried repeatedly to vomit the chicken back but this was unsuccessful.  He continued to have some drooling and awoke this morning and came to the emergency room.  He has a history of esophageal stricture and has required urgent endoscopy on 01/04/2017 by Dr. Unk who pushed food into the stomach and dilated pre-existing distal esophageal stricture to 15 mm.  Biopsies were also obtained at the time revealing normal squamous epithelium of the esophagus without evidence of eosinophilic esophagitis.  Small nodule in the new duodenum was biopsied and noted to be normal ectopic gastric tissue. Today the patient says he still Has some chest pressure without obvious chest pain.  He continues to drool into a bag and is unable to hold down his own secretions.  He is unable to drink or eat.  He denies any nausea, vomiting, hematemesis.  He denies abdominal pain.  He smokes 1 pack of cigarettes per day and drinks about a sixpack of beer per day. The patient does take NSAIDs occasionally but only when I really need them in the form of ibuprofen .  He is not taking any acid suppression medication such as omeprazole  or Nexium.  Past Medical History:  Past Medical History:  Diagnosis Date   Anxiety    Chronic back pain    Hypertension    has a prescription but has not had it filled.   Umbilical hernia    has had surgery to patch it    Problem List: Patient Active  Problem List   Diagnosis Date Noted   Left ear pain 04/23/2024   Viral URI with cough 04/23/2024   Smoker 04/23/2024   Alcohol dependence (HCC) 01/21/2017   Anxiety 01/21/2017   Chronic back pain 01/21/2017   Hypertension 01/21/2017   Seasonal allergies 01/21/2017    Past Surgical History: Past Surgical History:  Procedure Laterality Date   ANTERIOR CERVICAL DECOMP/DISCECTOMY FUSION N/A 07/16/2013   Procedure: ANTERIOR CERVICAL DECOMPRESSION/DISCECTOMY FUSION CERVICAL THREE-FOUR,FOUR-FIVE AND CERVICAL SIX-SEVEN;  Surgeon: Darina MALVA Boehringer, MD;  Location: [facility];  Service: Neurosurgery;  Laterality: N/A;   BACK SURGERY     ESOPHAGOGASTRODUODENOSCOPY N/A 01/04/2017   Procedure: ESOPHAGOGASTRODUODENOSCOPY (EGD);  Surgeon: Corinn Jess Unk, MD;  Location: [facility] ENDOSCOPY;  Service: Gastroenterology;  Laterality: N/A;   HERNIA REPAIR      Allergies: No Known Allergies  Home Medications: (Not in a hospital admission)  Home medication reconciliation was completed with the patient.   Scheduled Inpatient Medications:    Continuous Inpatient Infusions:    PRN Inpatient Medications:    Family History: family history includes Arthritis in his mother; Hypertension in his father.   GI Family History: Negative  Social History:   reports that he has been smoking cigarettes. He has never used smokeless tobacco. He reports current alcohol use of about 6.0 standard drinks of alcohol per week. He reports that he does not use drugs. The patient denies ETOH, tobacco, or drug  use.    Review of Systems: Review of Systems - Negative except history of present illness  Physical Examination: BP (!) 193/84   Pulse 64   Temp 98.2 F (36.8 C) (Oral)   Resp 18   Ht 6' 2 (1.88 m)   Wt 99.8 kg   SpO2 100%   BMI 28.25 kg/m  Physical Exam Constitutional:      General: He is not in acute distress.    Appearance: Normal appearance. He is normal weight. He is not toxic-appearing.  HENT:      Head: Normocephalic and atraumatic.     Right Ear: External ear normal.     Left Ear: External ear normal.     Nose: Nose normal.     Mouth/Throat:     Mouth: Mucous membranes are dry.     Pharynx: No oropharyngeal exudate or posterior oropharyngeal erythema.  Eyes:     General: No scleral icterus.    Extraocular Movements: Extraocular movements intact.     Conjunctiva/sclera: Conjunctivae normal.     Pupils: Pupils are equal, round, and reactive to light.  Cardiovascular:     Rate and Rhythm: Normal rate.     Pulses: Normal pulses.  Pulmonary:     Effort: Pulmonary effort is normal. No respiratory distress.     Breath sounds: No stridor. No rhonchi.  Abdominal:     General: There is no distension.     Palpations: Abdomen is soft. There is no mass.     Tenderness: There is no abdominal tenderness.  Musculoskeletal:        General: Normal range of motion.     Cervical back: Neck supple. No tenderness.  Skin:    General: Skin is warm and dry.     Capillary Refill: Capillary refill takes less than 2 seconds.     Coloration: Skin is not jaundiced.     Findings: No bruising.  Neurological:     General: No focal deficit present.     Mental Status: He is alert.  Psychiatric:        Mood and Affect: Mood normal.        Behavior: Behavior normal.        Thought Content: Thought content normal.        Judgment: Judgment normal.     Data: Lab Results  Component Value Date   WBC 6.7 06/18/2024   HGB 14.7 06/18/2024   HCT 43.0 06/18/2024   MCV 92.7 06/18/2024   PLT 179 06/18/2024   Recent Labs  Lab 06/18/24 0912  HGB 14.7   Lab Results  Component Value Date   NA 139 06/18/2024   K 4.2 06/18/2024   CL 99 06/18/2024   CO2 24 06/18/2024   BUN 12 06/18/2024   CREATININE 0.96 06/18/2024   Lab Results  Component Value Date   ALT 20 01/04/2017   AST 23 01/04/2017   ALKPHOS 70 01/04/2017   BILITOT 0.7 01/04/2017   No results for input(s): APTT, INR, PTT in  the last 168 hours.    Latest Ref Rng & Units 06/18/2024    9:12 AM 01/04/2017    8:04 PM 07/17/2013   10:48 PM  CBC  WBC 4.0 - 10.5 K/uL 6.7  13.4  14.9   Hemoglobin 13.0 - 17.0 g/dL 85.2  85.9  85.2   Hematocrit 39.0 - 52.0 % 43.0  41.5  43.4   Platelets 150 - 400 K/uL 179  172  140  STUDIES: No results found. @IMAGES @  Assessment: Active Problems:   * No active hospital problems. *  1.  Acute dysphagia-this appears to be related to inability to swallow secondary to probable esophageal food impaction. 2.  History of esophageal stricture status post biopsy and dilatation to 15 mm utilizing a CRE balloon on 01/04/2017-Dr. Vanga.. 3.  History of GERD, not medicated currently. 4.  Tobacco abuse. 5.  Regular alcohol use.  Recommendations: 1.  Assure adequate IV access. 2.  IV hydration as indicated. 3.  N.p.o. status. 4.  Proceed with urgent endoscopy with general anesthesia.  This will include foreign body removal and possible biopsy and possible esophageal dilatation.The patient understands the nature of the planned procedure, indications, risks, alternatives and potential complications including but not limited to bleeding, infection, perforation, damage to internal organs and possible oversedation/side effects from anesthesia. The patient agrees and gives consent to proceed.  Please refer to procedure notes for findings, recommendations and patient disposition/instructions.   Thank you for the consult. Please call with questions or concerns.  Aundria Tristan Eck MD [facility] [address] [place], [place] [address] [phone]  06/18/2024 12:34 PM

## 2024-06-18 NOTE — ED Notes (Signed)
 See triage note  Presents with possible food bolus  States feels like he got a piece of chicken stuck

## 2024-06-18 NOTE — ED Triage Notes (Signed)
 Pt to ED for piece of chicken stuck in throat since last night. States has stricture in throat. Pt swallowing, NAD noted.

## 2024-06-18 NOTE — Consult Note (Signed)
 [facility] GI Inpatient Consult Note   Ladell Francis Boss, M.D.  Reason for Consult: Food impaction in the esophagus, persistent dysphagia   Attending Requesting Consult: Lamar Price, MD  History of Present Illness: Tristan Thompson is a 58 y.o. male with a history of poor medical follow-up presents to the emergency room this morning with approximately 12 hours of inability to swallow.  Patient ate [employer] chicken dinner last night and was very hungry therefore ate very fast.  He was unable to swallow a large bite of chicken.  He tried repeatedly to vomit the chicken back but this was unsuccessful.  He continued to have some drooling and awoke this morning and came to the emergency room.  He has a history of esophageal stricture and has required urgent endoscopy on 01/04/2017 by Dr. Unk who pushed food into the stomach and dilated pre-existing distal esophageal stricture to 15 mm.  Biopsies were also obtained at the time revealing normal squamous epithelium of the esophagus without evidence of eosinophilic esophagitis.  Small nodule in the new duodenum was biopsied and noted to be normal ectopic gastric tissue. Today the patient says he still Has some chest pressure without obvious chest pain.  He continues to drool into a bag and is unable to hold down his own secretions.  He is unable to drink or eat.  He denies any nausea, vomiting, hematemesis.  He denies abdominal pain.  He smokes 1 pack of cigarettes per day and drinks about a sixpack of beer per day. The patient does take NSAIDs occasionally but only when I really need them in the form of ibuprofen .  He is not taking any acid suppression medication such as omeprazole  or Nexium.  Past Medical History:  Past Medical History:  Diagnosis Date   Anxiety    Chronic back pain    Hypertension    has a prescription but has not had it filled.   Umbilical hernia    has had surgery to patch it    Problem List: Patient Active  Problem List   Diagnosis Date Noted   Left ear pain 04/23/2024   Viral URI with cough 04/23/2024   Smoker 04/23/2024   Alcohol dependence (HCC) 01/21/2017   Anxiety 01/21/2017   Chronic back pain 01/21/2017   Hypertension 01/21/2017   Seasonal allergies 01/21/2017    Past Surgical History: Past Surgical History:  Procedure Laterality Date   ANTERIOR CERVICAL DECOMP/DISCECTOMY FUSION N/A 07/16/2013   Procedure: ANTERIOR CERVICAL DECOMPRESSION/DISCECTOMY FUSION CERVICAL THREE-FOUR,FOUR-FIVE AND CERVICAL SIX-SEVEN;  Surgeon: Darina MALVA Boehringer, MD;  Location: [facility];  Service: Neurosurgery;  Laterality: N/A;   BACK SURGERY     ESOPHAGOGASTRODUODENOSCOPY N/A 01/04/2017   Procedure: ESOPHAGOGASTRODUODENOSCOPY (EGD);  Surgeon: Corinn Jess Unk, MD;  Location: [facility] ENDOSCOPY;  Service: Gastroenterology;  Laterality: N/A;   HERNIA REPAIR      Allergies: No Known Allergies  Home Medications: (Not in a hospital admission)  Home medication reconciliation was completed with the patient.   Scheduled Inpatient Medications:    Continuous Inpatient Infusions:    PRN Inpatient Medications:    Family History: family history includes Arthritis in his mother; Hypertension in his father.   GI Family History: Negative  Social History:   reports that he has been smoking cigarettes. He has never used smokeless tobacco. He reports current alcohol use of about 6.0 standard drinks of alcohol per week. He reports that he does not use drugs. The patient denies ETOH, tobacco, or drug  use.    Review of Systems: Review of Systems - Negative except history of present illness  Physical Examination: BP (!) 193/84   Pulse 64   Temp 98.2 F (36.8 C) (Oral)   Resp 18   Ht 6' 2 (1.88 m)   Wt 99.8 kg   SpO2 100%   BMI 28.25 kg/m  Physical Exam Constitutional:      General: He is not in acute distress.    Appearance: Normal appearance. He is normal weight. He is not toxic-appearing.  HENT:      Head: Normocephalic and atraumatic.     Right Ear: External ear normal.     Left Ear: External ear normal.     Nose: Nose normal.     Mouth/Throat:     Mouth: Mucous membranes are dry.     Pharynx: No oropharyngeal exudate or posterior oropharyngeal erythema.  Eyes:     General: No scleral icterus.    Extraocular Movements: Extraocular movements intact.     Conjunctiva/sclera: Conjunctivae normal.     Pupils: Pupils are equal, round, and reactive to light.  Cardiovascular:     Rate and Rhythm: Normal rate.     Pulses: Normal pulses.  Pulmonary:     Effort: Pulmonary effort is normal. No respiratory distress.     Breath sounds: No stridor. No rhonchi.  Abdominal:     General: There is no distension.     Palpations: Abdomen is soft. There is no mass.     Tenderness: There is no abdominal tenderness.  Musculoskeletal:        General: Normal range of motion.     Cervical back: Neck supple. No tenderness.  Skin:    General: Skin is warm and dry.     Capillary Refill: Capillary refill takes less than 2 seconds.     Coloration: Skin is not jaundiced.     Findings: No bruising.  Neurological:     General: No focal deficit present.     Mental Status: He is alert.  Psychiatric:        Mood and Affect: Mood normal.        Behavior: Behavior normal.        Thought Content: Thought content normal.        Judgment: Judgment normal.     Data: Lab Results  Component Value Date   WBC 6.7 06/18/2024   HGB 14.7 06/18/2024   HCT 43.0 06/18/2024   MCV 92.7 06/18/2024   PLT 179 06/18/2024   Recent Labs  Lab 06/18/24 0912  HGB 14.7   Lab Results  Component Value Date   NA 139 06/18/2024   K 4.2 06/18/2024   CL 99 06/18/2024   CO2 24 06/18/2024   BUN 12 06/18/2024   CREATININE 0.96 06/18/2024   Lab Results  Component Value Date   ALT 20 01/04/2017   AST 23 01/04/2017   ALKPHOS 70 01/04/2017   BILITOT 0.7 01/04/2017   No results for input(s): APTT, INR, PTT in  the last 168 hours.    Latest Ref Rng & Units 06/18/2024    9:12 AM 01/04/2017    8:04 PM 07/17/2013   10:48 PM  CBC  WBC 4.0 - 10.5 K/uL 6.7  13.4  14.9   Hemoglobin 13.0 - 17.0 g/dL 85.2  85.9  85.2   Hematocrit 39.0 - 52.0 % 43.0  41.5  43.4   Platelets 150 - 400 K/uL 179  172  140  STUDIES: No results found. @IMAGES @  Assessment: Active Problems:   * No active hospital problems. *  1.  Acute dysphagia-this appears to be related to inability to swallow secondary to probable esophageal food impaction. 2.  History of esophageal stricture status post biopsy and dilatation to 15 mm utilizing a CRE balloon on 01/04/2017-Dr. Vanga.. 3.  History of GERD, not medicated currently. 4.  Tobacco abuse. 5.  Regular alcohol use.  Recommendations: 1.  Assure adequate IV access. 2.  IV hydration as indicated. 3.  N.p.o. status. 4.  Proceed with urgent endoscopy with general anesthesia.  This will include foreign body removal and possible biopsy and possible esophageal dilatation.The patient understands the nature of the planned procedure, indications, risks, alternatives and potential complications including but not limited to bleeding, infection, perforation, damage to internal organs and possible oversedation/side effects from anesthesia. The patient agrees and gives consent to proceed.  Please refer to procedure notes for findings, recommendations and patient disposition/instructions.   Thank you for the consult. Please call with questions or concerns.  Aundria Ladell Eck MD [facility] [address] [place], [place] [address] [phone]  06/18/2024 12:34 PM

## 2024-06-18 NOTE — Interval H&P Note (Signed)
 History and Physical Interval Note:  06/18/2024 12:43 PM  Tristan Thompson  has presented today for surgery, with the diagnosis of Esophageal food impaction, dysphagia.  The various methods of treatment have been discussed with the patient and family. After consideration of risks, benefits and other options for treatment, the patient has consented to  Procedure(s): EGD (ESOPHAGOGASTRODUODENOSCOPY) (N/A) as a surgical intervention.  The patient's history has been reviewed, patient examined, no change in status, stable for surgery.  I have reviewed the patient's chart and labs.  Questions were answered to the patient's satisfaction.     [place], Antonia Jicha

## 2024-06-18 NOTE — Transfer of Care (Signed)
 Immediate Anesthesia Transfer of Care Note  Patient: Tristan Thompson  Procedure(s) Performed: EGD (ESOPHAGOGASTRODUODENOSCOPY)  Patient Location: PACU  Anesthesia Type:General  Level of Consciousness: awake, alert , and oriented  Airway & Oxygen Therapy: Patient Spontanous Breathing  Post-op Assessment: Report given to RN and Post -op Vital signs reviewed and stable  Post vital signs: stable  Last Vitals:  Vitals Value Taken Time  BP 175/91 06/18/24 14:19  Temp 36.7 C 06/18/24 14:19  Pulse 81 06/18/24 14:21  Resp 30 06/18/24 14:21  SpO2 93 % 06/18/24 14:21  Vitals shown include unfiled device data.  Last Pain:  Vitals:   06/18/24 1419  TempSrc: Temporal  PainSc: 0-No pain         Complications: No notable events documented.

## 2024-06-18 NOTE — ED Provider Notes (Signed)
   [facility] Provider Note    Event Date/Time   First MD Initiated Contact with Patient 06/18/24 [phone]     (approximate)   History   Foreign Body   HPI  Tristan Thompson is a 58 y.o. male with a history of an esophageal stricture presents with complaints of food impaction.  Patient reports he was eating chicken last night and felt to get lodged in his esophagus.  He is unable to tolerate any liquids since.  He has been throwing up all night.  It will not dislodge.  He reports over the last week he has felt food get temporarily stuck but has passed     Physical Exam   Triage Vital Signs: ED Triage Vitals [06/18/24 0840]  Encounter Vitals Group     BP (!) 176/98     Girls Systolic BP Percentile      Girls Diastolic BP Percentile      Boys Systolic BP Percentile      Boys Diastolic BP Percentile      Pulse Rate 66     Resp 18     Temp 98.6 F (37 C)     Temp src      SpO2 96 %     Weight 99.8 kg (220 lb)     Height 1.88 m (6' 2)     Head Circumference      Peak Flow      Pain Score 9     Pain Loc      Pain Education      Exclude from Growth Chart     Most recent vital signs: Vitals:   06/18/24 0840  BP: (!) 176/98  Pulse: 66  Resp: 18  Temp: 98.6 F (37 C)  SpO2: 96%     General: Awake, no distress.  CV:  Good peripheral perfusion.  Resp:  Normal effort.  Abd:  No distention.  Other:     ED Results / Procedures / Treatments   Labs (all labs ordered are listed, but only abnormal results are displayed) Labs Reviewed  CBC  BASIC METABOLIC PANEL WITH GFR     EKG     RADIOLOGY     PROCEDURES:  Critical Care performed:   Procedures   MEDICATIONS ORDERED IN ED: Medications  glucagon  (human recombinant) (GLUCAGEN ) injection 1 mg (1 mg Intravenous Given 06/18/24 0907)     IMPRESSION / MDM / ASSESSMENT AND PLAN / ED COURSE  I reviewed the triage vital signs and the nursing notes. Patient's presentation is  most consistent with acute presentation with potential threat to life or bodily function.  Patient presents with likely esophageal food impaction, unable to tolerate p.o.'s without immediately vomiting.  Will trial IV glucagon  but likely will require GI assistance and endoscopy  Lab work reviewed and is reassuring  ----------------------------------------- 9:39 AM on 06/18/2024 -----------------------------------------  No improvement after IV glucagon , will consult GI      FINAL CLINICAL IMPRESSION(S) / ED DIAGNOSES   Final diagnoses:  Esophageal obstruction due to food impaction     Rx / DC Orders   ED Discharge Orders     None        Note:  This document was prepared using Dragon voice recognition software and may include unintentional dictation errors.   Arlander Charleston, MD 06/18/24 [phone]

## 2024-06-18 NOTE — Anesthesia Procedure Notes (Signed)
 Procedure Name: Intubation Date/Time: 06/18/2024 1:42 PM  Performed by: Stacie Channel, CRNAPre-anesthesia Checklist: Patient identified, Emergency Drugs available, Suction available and Patient being monitored Patient Re-evaluated:Patient Re-evaluated prior to induction Oxygen Delivery Method: Circle system utilized Preoxygenation: Pre-oxygenation with 100% oxygen Induction Type: IV induction Ventilation: Mask ventilation without difficulty Laryngoscope Size: McGrath and 4 Grade View: Grade I Tube type: Oral Tube size: 7.5 mm Number of attempts: 1 Airway Equipment and Method: Stylet and Oral airway Placement Confirmation: ETT inserted through vocal cords under direct vision, positive ETCO2 and breath sounds checked- equal and bilateral Secured at: 22 cm Tube secured with: Tape Dental Injury: Teeth and Oropharynx as per pre-operative assessment

## 2024-06-21 LAB — SURGICAL PATHOLOGY

## 2024-06-21 NOTE — Op Note (Signed)
 [facility]enter[facility] Patient Name: Tristan Thompson Procedure Date: 06/18/2024 1:33 PM MRN: [id number] Account #: [id number] Date of Birth: 1966/07/22 Admit Type: Outpatient Age: 58 Room: [facility] ENDO ROOM 4 Gender: Male Note Status: Finalized Instrument Name: Upper GI Scope [id number] Procedure:             Upper GI endoscopy Indications:           Removal of foreign body in the esophagus, Dysphagia Providers:             Hunner Garcon K. Aundria MD, MD Referring MD:          Alda Carpen (Referring MD) Medicines:             General Anesthesia Complications:         No immediate complications. Estimated blood loss:                         Minimal. Procedure:             Pre-Anesthesia Assessment:                        - The risks and benefits of the procedure and the                         sedation options and risks were discussed with the                         patient. All questions were answered and informed                         consent was obtained.                        - Patient identification and proposed procedure were                         verified prior to the procedure by the nurse. The                         procedure was verified in the procedure room.                        - ASA Grade Assessment: II - A patient with mild                         systemic disease.                        - After reviewing the risks and benefits, the patient                         was deemed in satisfactory condition to undergo the                         procedure.                        After obtaining informed consent, the endoscope was  passed under direct vision. Throughout the procedure,                         the patient's blood pressure, pulse, and oxygen                         saturations were monitored continuously. The Endoscope                         was introduced through the mouth, and advanced to the                          third part of duodenum. The upper GI endoscopy was                         technically difficult and complex due to presence of                         food. Successful completion of the procedure was aided                         by performing the maneuvers documented (below) in this                         report. The patient tolerated the procedure well. Findings:      Food was found in the lower third of the esophagus. Removal was       accomplished with a Raptor grasping device and Roth net. The foreign       body removal site was examined following endoscope reinsertion and       showed congestion, edema and erythema. Estimated blood loss was minimal.      There is no endoscopic evidence of stenosis, stricture or mass in the       lower third of the esophagus.      Patchy mild inflammation characterized by erosions and erythema was       found in the gastric antrum. Biopsies were taken with a cold forceps for       Helicobacter pylori testing. Estimated blood loss was minimal.      Clear fluid was found in the gastric body. Fluid aspiration was       performed. Estimated blood loss: none.      The exam of the stomach was otherwise normal.      Patchy mildly erythematous mucosa without active bleeding and with no       stigmata of bleeding was found in the first portion of the duodenum.      Few non-bleeding superficial duodenal ulcers with no stigmata of       bleeding were found in the second portion of the duodenum. Estimated       blood loss: none.      The third portion of the duodenum was normal. Estimated blood loss: none.      The exam was otherwise without abnormality. Impression:            - Food was found in the esophagus. Removal was                         successful.                        -  Gastritis. Biopsied.                        - Clear gastric fluid. Fluid aspiration performed.                        - Erythematous duodenopathy.                        -  Non-bleeding duodenal ulcers with no stigmata of                         bleeding.                        - Normal third portion of the duodenum.                        - The examination was otherwise normal. Recommendation:        - Patient has a contact number available for                         emergencies. The signs and symptoms of potential                         delayed complications were discussed with the patient.                         Return to normal activities tomorrow. Written                         discharge instructions were provided to the patient.                        - Mechanical soft diet for 1 day.                        - Advance diet as tolerated after 24-36 hours.                        - Use Prilosec OTC 20 mg PO daily.                        - No aspirin, ibuprofen , naproxen, or other                         non-steroidal anti-inflammatory drugs.                        - Return to my office PRN.                        - The findings and recommendations were discussed with                         the patient. Procedure Code(s):     --- Professional ---                        [phone], Esophagogastroduodenoscopy, flexible,  transoral; with removal of foreign body(s)                        43239, Esophagogastroduodenoscopy, flexible,                         transoral; with biopsy, single or multiple Diagnosis Code(s):     --- Professional ---                        R13.10, Dysphagia, unspecified                        T18.108A, Unspecified foreign body in esophagus                         causing other injury, initial encounter                        K26.9, Duodenal ulcer, unspecified as acute or                         chronic, without hemorrhage or perforation                        K31.89, Other diseases of stomach and duodenum                        K29.70, Gastritis, unspecified, without bleeding                        T18.128A, Food  in esophagus causing other injury,                         initial encounter CPT copyright 2022 American Medical Association. All rights reserved. The codes documented in this report are preliminary and upon coder review may  be revised to meet current compliance requirements. Ladell MARLA Boss MD, MD 06/18/2024 2:23:22 PM This report has been signed electronically. Number of Addenda: 0 Note Initiated On: 06/18/2024 1:33 PM Estimated Blood Loss:  Estimated blood loss was minimal.      [facility]

## 2024-06-21 NOTE — Anesthesia Postprocedure Evaluation (Signed)
 Anesthesia Post Note  Patient: Ventura Hollenbeck Kramar  Procedure(s) Performed: EGD (ESOPHAGOGASTRODUODENOSCOPY)  Patient location during evaluation: Endoscopy Anesthesia Type: General Level of consciousness: awake and alert Pain management: pain level controlled Vital Signs Assessment: post-procedure vital signs reviewed and stable Respiratory status: spontaneous breathing, nonlabored ventilation and respiratory function stable Cardiovascular status: blood pressure returned to baseline and stable Postop Assessment: no apparent nausea or vomiting Anesthetic complications: no   No notable events documented.   Last Vitals:  Vitals:   06/18/24 1429 06/18/24 1439  BP: (!) 144/77 (!) 158/94  Pulse: 81 74  Resp: (!) 22 (!) 24  Temp:    SpO2: 97% 96%    Last Pain:  Vitals:   06/18/24 1439  TempSrc:   PainSc: 0-No pain                 Camellia Merilee Louder

## 2024-07-12 ENCOUNTER — Ambulatory Visit
Admission: EM | Admit: 2024-07-12 | Discharge: 2024-07-12 | Disposition: A | Discharge status: Home / Self Care | Attending: Emergency Medicine | Admitting: Emergency Medicine

## 2024-07-12 DIAGNOSIS — F411 Generalized anxiety disorder: Secondary | ICD-10-CM | POA: Insufficient documentation

## 2024-07-12 DIAGNOSIS — I10 Essential (primary) hypertension: Secondary | ICD-10-CM

## 2024-07-12 DIAGNOSIS — U071 COVID-19: Secondary | ICD-10-CM | POA: Diagnosis not present

## 2024-07-12 LAB — POC COVID19/FLU A&B COMBO
Covid Antigen, POC: POSITIVE — AB
Influenza A Antigen, POC: NEGATIVE
Influenza B Antigen, POC: NEGATIVE

## 2024-07-12 MED ORDER — PROMETHAZINE-DM 6.25-15 MG/5ML PO SYRP: 5.0000 mL | ORAL_SOLUTION | Freq: Four times a day (QID) | ORAL | 0 refills | Status: AC | PRN

## 2024-07-12 MED ORDER — IBUPROFEN 600 MG PO TABS: 600.0000 mg | ORAL_TABLET | Freq: Four times a day (QID) | ORAL | 0 refills | Status: AC | PRN

## 2024-07-12 MED ORDER — MOLNUPIRAVIR EUA 200MG CAPSULE: 4.0000 | ORAL_CAPSULE | Freq: Two times a day (BID) | ORAL | 0 refills | Status: AC

## 2024-07-12 MED ORDER — FLUTICASONE PROPIONATE 50 MCG/ACT NA SUSP: 2.0000 | Freq: Every day | NASAL | 0 refills | Status: AC

## 2024-07-12 NOTE — ED Triage Notes (Signed)
 Pt c/o cough,HA,fever & congestion x3 days. States took covid test yesterday & thinks it was +.  Has tried OTC meds w/o relief.

## 2024-07-12 NOTE — ED Provider Notes (Signed)
 HPI  SUBJECTIVE:  Tristan Thompson is a 58 y.o. male who presents with 2 to 3 days of dry cough, nasal congestion, chest congestion, rhinorrhea, fatigue, occasional headache, postnasal drip and some diarrhea.  No fevers, body aches, sinus pain or pressure, sore throat, wheezing or shortness of breath, nausea, vomiting, abdominal pain.  No known COVID or flu exposure.  He got 1 dose of the COVID-vaccine.  Did not yet get the series flu vaccine.  No antipyretic in the past 6 hours.  He tried Alka-Seltzer cold and flu with improvement in his symptoms.  No eye infections.  He states he had a positive home COVID test the other day. He has a past medical history of hypertension and states that he took his medications this morning.  He also has a history of hypercholesterolemia.  He does not measure his blood pressure at home.  He has had COVID multiple times and has been treated with Paxlovid  in the past.  [facility]  Past Medical History:  Diagnosis Date   Anxiety    Chronic back pain    Hypertension    has a prescription but has not had it filled.   Umbilical hernia    has had surgery to patch it    Past Surgical History:  Procedure Laterality Date   ANTERIOR CERVICAL DECOMP/DISCECTOMY FUSION N/A 07/16/2013   Procedure: ANTERIOR CERVICAL DECOMPRESSION/DISCECTOMY FUSION CERVICAL THREE-FOUR,FOUR-FIVE AND CERVICAL SIX-SEVEN;  Surgeon: Darina MALVA Boehringer, MD;  Location: [facility];  Service: Neurosurgery;  Laterality: N/A;   BACK SURGERY     ESOPHAGOGASTRODUODENOSCOPY N/A 01/04/2017   Procedure: ESOPHAGOGASTRODUODENOSCOPY (EGD);  Surgeon: Corinn Jess Brooklyn, MD;  Location: [facility] ENDOSCOPY;  Service: Gastroenterology;  Laterality: N/A;   ESOPHAGOGASTRODUODENOSCOPY N/A 06/18/2024   Procedure: EGD (ESOPHAGOGASTRODUODENOSCOPY);  Surgeon: Toledo, Ladell POUR, MD;  Location: [facility] ENDOSCOPY;  Service: Gastroenterology;  Laterality: N/A;   HERNIA REPAIR      Family History  Problem Relation Age  of Onset   Arthritis Mother    Hypertension Father     Social History   Tobacco Use   Smoking status: Every Day    Current packs/day: 0.50    Types: Cigarettes   Smokeless tobacco: Never  Vaping Use   Vaping status: Never Used  Substance Use Topics   Alcohol use: Yes    Alcohol/week: 6.0 standard drinks of alcohol    Types: 6 Cans of beer per week    Comment: six pack of beer/day   Drug use: No    No current facility-administered medications for this encounter.  Current Outpatient Medications:    fluticasone (FLONASE) 50 MCG/ACT nasal spray, Place 2 sprays into both nostrils daily., Disp: 16 g, Rfl: 0   ibuprofen  (ADVIL ) 600 MG tablet, Take 1 tablet (600 mg total) by mouth every 6 (six) hours as needed., Disp: 30 tablet, Rfl: 0   molnupiravir  EUA (LAGEVRIO ) 200 mg CAPS capsule, Take 4 capsules (800 mg total) by mouth 2 (two) times daily for 5 days., Disp: 40 capsule, Rfl: 0   promethazine -dextromethorphan (PROMETHAZINE -DM) 6.25-15 MG/5ML syrup, Take 5 mLs by mouth 4 (four) times daily as needed for cough., Disp: 118 mL, Rfl: 0   escitalopram (LEXAPRO) 10 MG tablet, Take 15 mg by mouth daily., Disp: , Rfl:    escitalopram (LEXAPRO) 5 MG tablet, Take 5 mg by mouth daily. (Patient not taking: Reported on 06/18/2024), Disp: , Rfl:    hydrochlorothiazide (MICROZIDE) 12.5 MG capsule, Take 12.5 mg by mouth daily., Disp: ,  Rfl:    lidocaine  (LIDODERM ) 5 %, Place 1 patch onto the skin daily. Remove & Discard patch within 12 hours or as directed by MD, Disp: 30 patch, Rfl: 0   lovastatin (MEVACOR) 40 MG tablet, Take 1 tablet by mouth at bedtime., Disp: , Rfl:    tiZANidine (ZANAFLEX) 4 MG tablet, 1/2-1 tid prn (Patient not taking: Reported on 06/18/2024), Disp: , Rfl:   No Known Allergies   ROS  As noted in HPI.   Physical Exam  BP (!) 182/101 (BP Location: Left Arm)   Pulse 60   Temp 98.2 F (36.8 C) (Oral)   Resp 18   Wt 101.8 kg   SpO2 100%   BMI 28.81 kg/m  BP Readings  from Last 3 Encounters:  07/12/24 (!) 182/101  06/18/24 (!) 158/94  06/17/24 (!) 160/85    Constitutional: Well developed, well nourished, no acute distress Eyes: PERRL, EOMI, conjunctiva normal bilaterally HENT: Normocephalic, atraumatic,mucus membranes moist.  Positive nasal congestion.  No maxillary, frontal sinus tenderness.  Normal oropharynx.  No postnasal drip. Neck: Positive cervical lymphadenopathy Respiratory: Clear to auscultation bilaterally, no rales, no wheezing, no rhonchi Cardiovascular: Normal rate and rhythm, no murmurs, no gallops, no rubs GI: nondistended skin: No rash, skin intact Musculoskeletal: no deformities Neurologic: Alert & oriented x 3, CN III-XII grossly intact, no motor deficits, sensation grossly intact Psychiatric: Speech and behavior appropriate   ED Course   Medications - No data to display  Orders Placed This Encounter  Procedures   POC Covid19/Flu A&B Antigen    Standing Status:   Standing    Number of Occurrences:   1   Results for orders placed or performed during the hospital encounter of 07/12/24 (from the past 24 hours)  POC Covid19/Flu A&B Antigen     Status: Abnormal   Collection Time: 07/12/24  8:55 AM  Result Value Ref Range   Influenza A Antigen, POC Negative Negative   Influenza B Antigen, POC Negative Negative   Covid Antigen, POC Positive (A) Negative   No results found.  ED Clinical Impression  1. COVID-19 virus infection   2. Elevated blood pressure reading with diagnosis of hypertension      ED Assessment/Plan    1.  COVID infection.  COVID-positive.  Discussed with patient while in department.  Patient qualifies for antivirals due to history of hypertension and partially unvaccinated status.  He does not want Paxlovid  because it caused a foul taste in his mouth last time.  Will prescribe molnupiravir  I discussed with him that it is less effective.  In the meantime, supportive treatment with Tylenol /ibuprofen ,  Promethazine  DM and Mucinex, Flonase, saline nasal irrigation, work note for today.  Discussed he must mask around others at all times for the next 8 days.  Needs work note for today.  He is off for the next 4 days.  2.  Elevated blood-pressure reading with diagnosis of hypertension.  Patient otherwise symptomatic.  Discussed with him the importance of keeping blood pressure under control.  Advised him to monitor this at home and follow-up with his primary care provider if remains consistently elevated above 130/80.  Will provide hypertensive emergency return precautions.  Discussed labs, MDM, treatment plan, and plan for follow-up with patient Discussed sn/sx that should prompt return to the ED. patient agrees with plan.   Meds ordered this encounter  Medications   fluticasone (FLONASE) 50 MCG/ACT nasal spray    Sig: Place 2 sprays into both nostrils daily.  Dispense:  16 g    Refill:  0   molnupiravir  EUA (LAGEVRIO ) 200 mg CAPS capsule    Sig: Take 4 capsules (800 mg total) by mouth 2 (two) times daily for 5 days.    Dispense:  40 capsule    Refill:  0   promethazine -dextromethorphan (PROMETHAZINE -DM) 6.25-15 MG/5ML syrup    Sig: Take 5 mLs by mouth 4 (four) times daily as needed for cough.    Dispense:  118 mL    Refill:  0   ibuprofen  (ADVIL ) 600 MG tablet    Sig: Take 1 tablet (600 mg total) by mouth every 6 (six) hours as needed.    Dispense:  30 tablet    Refill:  0      *This clinic note was created using Scientist, clinical (histocompatibility and immunogenetics). Therefore, there may be occasional mistakes despite careful proofreading. ?    Van Knee, MD 07/12/24 1525

## 2024-07-12 NOTE — Discharge Instructions (Addendum)
 Your COVID is positive.  You must mask around others at all times for the next 8 days.  I have prescribed molnupiravir .  Go to http://lagevrio .com/patients/coupon/ or to https://www.activatethecard.com/8205/# and enter in your information, and you will get it at a significantly reduced price, if not for free.  Go to the ER for difficulty breathing, if you get worse, or further concerns.   In the meantime, take 600 mg of ibuprofen , 1000 mg of Tylenol  together 3-4 times a day as needed for body aches, headaches.  Promethazine  DM for cough.  Plain Mucinex, Flonase, saline nasal irrigation with a NeilMed sinus rinse with distilled water as often as you want.  Decrease your salt intake. diet and exercise will lower your blood pressure significantly. It is important to keep your blood pressure under good control, as having a elevated blood pressure for prolonged periods of time significantly increases your risk of stroke, heart attacks, kidney damage, eye damage, and other problems. Get a validated blood pressure cuff that goes on your arm, not your wrist.  Measure your blood pressure once a day, preferably at the same time every day. Keep a log of this and bring it to your next doctor's appointment.  Bring your blood pressure cuff as well.  Return here in 2 weeks for blood pressure recheck if you're unable to find a primary care physician by then. Return immediately to the ER if you start having chest pain, headache, problems seeing, problems talking, problems walking, if you feel like you're about to pass out, if you do pass out, if you have a seizure, or for any other concerns.  Go to www.goodrx.com  or www.costplusdrugs.com to look up your medications. This will give you a list of where you can find your prescriptions at the most affordable prices. Or ask the pharmacist what the cash price is, or if they have any other discount programs available to help make your medication more affordable. This can be less  expensive than what you would pay with insurance.

## 2024-07-14 NOTE — Progress Notes (Signed)
 Chief Complaint  Patient presents with  . Hypertension    HPI  Tristan Thompson is a 58 y.o. male here for f/u of chronic medical issues  Hypertension: Patient states that blood pressure has been elevated above 150/90.  Taking HCTZ as prescribed without adverse effects.  Denies any chest pain or CVA symptoms.  Hyperlipidemia: Admits to not taking lovastatin as prescribed.  Denies any chest pain or CVA symptoms.  Borderline DM: No acute issues.  No meds.  Asymptomatic.  Not checking CBGs.  Tobacco dependence: Still smoking around 1/2 packs/day and sometimes more.  Wanting to quit.  Interested in Chantix.  Denies any suicidal ideations.  Alcohol dependence: Patient states that he is still drinking regularly.  Would like to quit sometime in the future.  Not inhibiting function.  ROS Review of systems is unremarkable for any active cardiac, respiratory, GI, GU, hematologic, neurologic, dermatologic, HEENT, or psychiatric symptoms except as noted above.  No fevers, chills, or constitutional symptoms.   Current Outpatient Medications  Medication Sig Dispense Refill  . acetaminophen  (TYLENOL ) 500 MG tablet Take 1,000 mg by mouth every 8 (eight) hours as needed for Pain    . diazePAM  (VALIUM ) 5 MG tablet 1-2 30 minutes before MRI 2 tablet 0  . escitalopram oxalate (LEXAPRO) 10 MG tablet TAKE 1 AND 1/2 TABLETS(15 MG) BY MOUTH EVERY DAY 135 tablet 1  . famotidine  (PEPCID ) 20 MG tablet Take 20 mg by mouth 2 (two) times daily as needed    . fluticasone  propionate (FLONASE ) 50 mcg/actuation nasal spray Place 2 sprays into both nostrils once daily    . hydroCHLOROthiazide (HYDRODIURIL) 25 MG tablet Take 1 tablet (25 mg total) by mouth once daily 100 tablet 1  . ibuprofen  (MOTRIN ) 800 MG tablet Take 800 mg by mouth every 6 (six) hours as needed for Pain    . ipratropium (ATROVENT ) 21 mcg (0.03 %) nasal spray Place 2 sprays into both nostrils 2 (two) times daily as needed    . lidocaine  (LIDODERM ) 5 %  patch Place 1 patch onto the skin once daily REMOVE & DISCARD PATCH WITHIN 12 HOURS OR AS DIRECTED BY MD    . lovastatin (MEVACOR) 40 MG tablet Take 2 tablets (80 mg total) by mouth at bedtime 200 tablet 1  . omeprazole  (PRILOSEC) 20 MG DR capsule Take 1 capsule by mouth once daily as needed    . promethazine -dextromethorphan (PROMETHAZINE -DM) 6.25-15 mg/5 mL syrup Take 5 mLs by mouth every 6 (six) hours as needed    . tiZANidine (ZANAFLEX) 4 MG tablet 1/2-1 tid prn 90 tablet 1  . varenicline tartrate (CHANTIX STARTING MONTH PAK) tablet Follow package directions. 53 tablet 0   No current facility-administered medications for this visit.    Allergies as of 07/14/2024  . (No Known Allergies)    Patient Active Problem List  Diagnosis  . GAD (generalized anxiety disorder)  . Essential hypertension  . Chronic back pain - followed by Dr. Dodson  . Seasonal allergies  . Alcohol dependence (CMS-HCC)  . Tobacco dependence (1/2 ppd)  . Borderline diabetes mellitus (A1c 6.3% - 03/25/24) - diet controlled  . Hyperlipemia, mixed (Trig 260, LDL 149 - 03/25/24)  . Other male erectile dysfunction    Past Medical History:  Diagnosis Date  . Alcohol dependence (CMS/HHS-HCC)   . Allergic state   . Anxiety   . Chronic back pain   . Hypertension     Past Surgical History:  Procedure Laterality Date  . EGD @  [facility]  06/18/2024   Gastritis/ No Repeat/ [place]  . HERNIA REPAIR      Vitals:   07/14/24 1154 07/14/24 1200  BP: (!) 158/90 (!) 170/86  Pulse: 64   SpO2: 98%   Weight: 98.9 kg (218 lb)   Height: 184 cm (6' 0.44)   PainSc:   5   PainLoc: Back    Body mass index is 29.21 kg/m.  Exam  General. Well appearing; NAD; VS reviewed     Eyes. Sclera and conjunctiva clear; Vision grossly intact; extraocular movements intact Neck. Supple.  Lungs. Respirations unlabored; clear to auscultation bilaterally Cardiovascular. Heart regular rate and rhythm without murmurs, gallops, or  rubs Abdomen. Soft; non tender; non distended; no masses or organomegaly Extremities. no edema Skin. Normal color and turgor Neurologic. Alert and oriented x3; CN 2-12 grossly intact; no focal deficits  Assessment and Plan  1. Essential hypertension Not at goal.  Will increase HCTZ to 25 mg daily and reassess kidney function and electrolytes in 3 months.  Monitor blood pressure twice a week with goal less than 140/90. -     Comprehensive Metabolic Panel (CMP); Future -     CBC w/auto Differential (5 Part); Future  2. Borderline diabetes mellitus (A1c 6.3% - 03/25/24) - diet controlled Unchanged.  Continue with low sugar/starch diet and exercise.  No meds.  No need to check CBGs. -     Hemoglobin A1C; Future -     Microalbumin, Random Urine; Future  3. Uncomplicated alcohol dependence (CMS/HHS-HCC) Unchanged.  Counseled on alcohol cessation.  4. Tobacco dependence (1/2 ppd) Not at goal.  Interested in quitting.  Will start on Chantix.  Will call back in 2 to 3 weeks if needs a maintenance refill.  5. Hyperlipemia, mixed (Trig 260, LDL 149 - 03/25/24) Unchanged.  Not at goal.  Secondary to not taking his lovastatin as prescribed.  Recommend lovastatin 80 mg nightly.  Reassess in 3 months.  LFTs within normal limits. -     Lipid Panel w/calc LDL; Future -     CBC w/auto Differential (5 Part); Future  Other orders -     hydroCHLOROthiazide (HYDRODIURIL) 25 MG tablet; Take 1 tablet (25 mg total) by mouth once daily -     varenicline tartrate (CHANTIX STARTING MONTH PAK) tablet; Follow package directions. -     Follow up in Primary Care; Future  F/u 3 months for PE.  Labs prior.  Patient to call back in 2 to 3 weeks if he is tolerating Chantix and needs maintenance pack ALDA CARPEN, MD

## 2024-08-06 ENCOUNTER — Emergency Department
Admission: EM | Admit: 2024-08-06 | Discharge: 2024-08-06 | Disposition: A | Discharge status: Home / Self Care | Attending: Emergency Medicine | Admitting: Emergency Medicine

## 2024-08-06 ENCOUNTER — Other Ambulatory Visit: Payer: Self-pay

## 2024-08-06 ENCOUNTER — Emergency Department

## 2024-08-06 DIAGNOSIS — Z79899 Other long term (current) drug therapy: Secondary | ICD-10-CM | POA: Insufficient documentation

## 2024-08-06 DIAGNOSIS — R519 Headache, unspecified: Secondary | ICD-10-CM | POA: Diagnosis present

## 2024-08-06 DIAGNOSIS — I10 Essential (primary) hypertension: Secondary | ICD-10-CM | POA: Diagnosis not present

## 2024-08-06 LAB — CBC WITH DIFFERENTIAL/PLATELET
Abs Immature Granulocytes: 0.02 K/uL (ref 0.00–0.07)
Basophils Absolute: 0 K/uL (ref 0.0–0.1)
Basophils Relative: 1 %
Eosinophils Absolute: 0.1 K/uL (ref 0.0–0.5)
Eosinophils Relative: 1 %
HCT: 44 % (ref 39.0–52.0)
Hemoglobin: 15.2 g/dL (ref 13.0–17.0)
Immature Granulocytes: 0 %
Lymphocytes Relative: 25 %
Lymphs Abs: 2 K/uL (ref 0.7–4.0)
MCH: 31.5 pg (ref 26.0–34.0)
MCHC: 34.5 g/dL (ref 30.0–36.0)
MCV: 91.1 fL (ref 80.0–100.0)
Monocytes Absolute: 0.7 K/uL (ref 0.1–1.0)
Monocytes Relative: 8 %
Neutro Abs: 5.2 K/uL (ref 1.7–7.7)
Neutrophils Relative %: 65 %
Platelets: 206 K/uL (ref 150–400)
RBC: 4.83 MIL/uL (ref 4.22–5.81)
RDW: 12.6 % (ref 11.5–15.5)
WBC: 8 K/uL (ref 4.0–10.5)
nRBC: 0 % (ref 0.0–0.2)

## 2024-08-06 LAB — BASIC METABOLIC PANEL WITH GFR
Anion gap: 12 (ref 5–15)
BUN: 11 mg/dL (ref 6–20)
CO2: 26 mmol/L (ref 22–32)
Calcium: 9.5 mg/dL (ref 8.9–10.3)
Chloride: 96 mmol/L — ABNORMAL LOW (ref 98–111)
Creatinine, Ser: 1.23 mg/dL (ref 0.61–1.24)
GFR, Estimated: >60 mL/min (ref >60)
Glucose, Bld: 115 mg/dL — ABNORMAL HIGH (ref 70–99)
Potassium: 4 mmol/L (ref 3.5–5.1)
Sodium: 134 mmol/L — ABNORMAL LOW (ref 135–145)

## 2024-08-06 LAB — TROPONIN T, HIGH SENSITIVITY: Troponin T High Sensitivity: <15 ng/L (ref 0–19)

## 2024-08-06 MED ORDER — METOCLOPRAMIDE HCL 10 MG PO TABS
10.0000 mg | ORAL_TABLET | Freq: Once | ORAL | Status: AC
  Administered 2024-08-06: 10 mg via ORAL
  Filled 2024-08-06: qty 1

## 2024-08-06 MED ORDER — ACETAMINOPHEN 500 MG PO TABS
1000.0000 mg | ORAL_TABLET | Freq: Once | ORAL | Status: AC
  Administered 2024-08-06: 1000 mg via ORAL
  Filled 2024-08-06: qty 2

## 2024-08-06 NOTE — ED Provider Notes (Signed)
 [facility] Provider Note    Event Date/Time   First MD Initiated Contact with Patient 08/06/24 1258     (approximate)   History   Hypertension   HPI  Tristan Thompson is a 58 y.o. male history of hypertension, hyperlipidemia, chronic back pain, anxiety, presenting with hypertension.  States that he noted pulsations to the left temporal region.  Took his blood pressure noted to be elevated.  States has been elevated for the last several weeks.  Went to see his primary care doctor who doubled his blood pressure medications.  He states that he has not been helping with his blood pressures.  He denies any vision changes, no focal weakness or numbness, no chest pain or shortness of breath, no new pain anywhere no new falls.  No prior history of strokes.  Does not take any anticoagulation.  Per independent history from EMS, he was hypertensive to the 200s.  On independent chart review, he was seen by primary care early November, is taking hydrochlorothiazide, did have his medication increased to 25 mg daily.  Does also have history of alcohol use.     Physical Exam   Triage Vital Signs: ED Triage Vitals  Encounter Vitals Group     BP      Girls Systolic BP Percentile      Girls Diastolic BP Percentile      Boys Systolic BP Percentile      Boys Diastolic BP Percentile      Pulse      Resp      Temp      Temp src      SpO2      Weight      Height      Head Circumference      Peak Flow      Pain Score      Pain Loc      Pain Education      Exclude from Growth Chart     Most recent vital signs: Vitals:   08/06/24 1301  BP: (!) 207/89  Pulse: 67  Resp: 17  Temp: 98.6 F (37 C)  SpO2: 100%     General: Awake, no distress.  CV:  Good peripheral perfusion.  Resp:  Normal effort.  No tachypnea or respiratory distress Abd:  No distention.  Soft nontender Other:  Nontoxic-appearing, pupils are equal and reactive, extraocular movements are  intact, no cranial nerve deficits, no focal weakness or numbness.   ED Results / Procedures / Treatments   Labs (all labs ordered are listed, but only abnormal results are displayed) Labs Reviewed  BASIC METABOLIC PANEL WITH GFR - Abnormal; Notable for the following components:      Result Value   Sodium 134 (*)    Chloride 96 (*)    Glucose, Bld 115 (*)    All other components within normal limits  CBC WITH DIFFERENTIAL/PLATELET  TROPONIN T, HIGH SENSITIVITY     EKG  EKG shows, sinus rhythm, rate 68, normal QS, normal QTc, no obvious ischemic ST elevation, T wave flattening in aVL, not significantly changed compared to prior   RADIOLOGY On my independent interpretation, CT head without obvious intracranial hemorrhage   PROCEDURES:  Critical Care performed: No  Procedures   MEDICATIONS ORDERED IN ED: Medications  metoCLOPramide  (REGLAN ) tablet 10 mg (10 mg Oral Given 08/06/24 1312)  acetaminophen  (TYLENOL ) tablet 1,000 mg (1,000 mg Oral Given 08/06/24 1312)     IMPRESSION /  MDM / ASSESSMENT AND PLAN / ED COURSE  I reviewed the triage vital signs and the nursing notes.                              Differential diagnosis includes, but is not limited to, tension headache, migraine, hypertension, no evidence of neurodeficits suggest CVA, he is not altered, he states that he does note some palpitations but no actual headache.  No thunderclap headache.  Doubt subarachnoid intracranial hemorrhage.  Get labs, CT, will treat him for the headache.  Reassess.  Likely need to be to have second antihypertensive added to his regimen.  Patient's presentation is most consistent with acute presentation with potential threat to life or bodily function.  Independent interpretation of labs and imaging below.  Clinical course as below.  Given that he is asymptomatic at this time, blood pressures are coming down without intervention, he safe for outpatient management.  Discussed with him  to call his primary care doctor on Monday to schedule follow-up appointment, he might need his medications adjusted for blood pressure.  Otherwise considered but no indication for inpatient admission at this time, he safe for outpatient management.  Will discharge with strict return precautions.  Shared decision making done with patient and he is agreeable with the plan.    Clinical Course as of 08/06/24 1401  Fri Aug 06, 2024  1336 On reassessment patient is asymptomatic at this time, blood pressure is 160. [TT]  1359 Independent review of labs, troponins not elevated, electrolytes not severely deranged, no leukocytosis. [TT]  1400 CT Head Wo Contrast 1. No acute intracranial abnormalities.  [TT]    Clinical Course User Index [TT] Waymond, Lorelle Cummins, MD     FINAL CLINICAL IMPRESSION(S) / ED DIAGNOSES   Final diagnoses:  Hypertension, unspecified type  Acute nonintractable headache, unspecified headache type     Rx / DC Orders   ED Discharge Orders     None        Note:  This document was prepared using Dragon voice recognition software and may include unintentional dictation errors.    Waymond Lorelle Cummins, MD 08/06/24 [phone]

## 2024-08-06 NOTE — ED Triage Notes (Signed)
 Pt comes in via [facility] with complaints of hypertension. Pt's BP medications were increased 2-3 weeks ago. Pt was recently diagnosed with covid about a month ago, and feels his BP has increased since. Pt has a history of chronic back pain. Pt with no complaints of cp or SOB or dizziness. Pt complains of thumping sensation in his head 4/10.

## 2024-08-06 NOTE — Discharge Instructions (Signed)
 Please make sure to call your primary care doctor on the first business day that they are open to schedule follow-up appointment for further management of your high blood pressure.

## 2024-10-13 ENCOUNTER — Ambulatory Visit
Admission: EM | Admit: 2024-10-13 | Discharge: 2024-10-13 | Disposition: A | Discharge status: Home / Self Care | Source: Home / Self Care | Attending: Family Medicine | Admitting: Family Medicine

## 2024-10-13 DIAGNOSIS — J069 Acute upper respiratory infection, unspecified: Secondary | ICD-10-CM

## 2024-10-13 LAB — POC SOFIA SARS ANTIGEN FIA: SARS Coronavirus 2 Ag: NEGATIVE

## 2024-10-13 LAB — POCT INFLUENZA A/B
Influenza A, POC: NEGATIVE
Influenza B, POC: NEGATIVE

## 2024-10-13 MED ORDER — ONDANSETRON 4 MG PO TBDP
4.0000 mg | ORAL_TABLET | Freq: Once | ORAL | Status: AC
  Administered 2024-10-13: 4 mg via ORAL

## 2024-10-13 MED ORDER — HYDROCOD POLI-CHLORPHE POLI ER 10-8 MG/5ML PO SUER: 5.0000 mL | Freq: Two times a day (BID) | ORAL | 0 refills | Status: AC | PRN

## 2024-10-13 MED ORDER — ONDANSETRON HCL 4 MG PO TABS: 4.0000 mg | ORAL_TABLET | Freq: Three times a day (TID) | ORAL | 0 refills | Status: AC | PRN

## 2024-10-13 MED ORDER — ONDANSETRON HCL 4 MG PO TABS: 4.0000 mg | ORAL_TABLET | Freq: Three times a day (TID) | ORAL | Status: DC | PRN

## 2024-10-13 NOTE — ED Triage Notes (Signed)
 Pt c/o bilateral ear pain,sinus pressure,nausea & diarrhea x3 days. Has tried OTC meds w/o relief.

## 2024-10-13 NOTE — Discharge Instructions (Addendum)
 Your influenza and COVID are negative. You most likely have a viral respiratory infection that will gradually improve over the next 7-10 days. Cough may last up to 3 weeks. If your were prescribed medication, stop by the pharmacy to pick them up.  You can take Tylenol and/or Ibuprofen  as needed for fever reduction and pain relief.    For cough: honey 1/2 to 1 teaspoon (you can dilute the honey in water or another fluid). You can also use guaifenesin and dextromethorphan for cough.  You can use a humidifier for chest congestion and cough.  If you don't have a humidifier, you can sit in the bathroom with the hot shower running.      For sore throat: try warm salt water gargles, Mucinex sore throat cough drops or cepacol lozenges, throat spray, warm tea or water with lemon/honey, popsicles or ice, or OTC cold relief medicine for throat discomfort. You can also purchase chloraseptic spray at the pharmacy or dollar store.    For congestion: take a daily anti-histamine like Zyrtec, Claritin, and a oral decongestant, such as pseudoephedrine.  Saline nasal sprays can be used frequently to clear nasal congestion. Afrin is also a good option, if you do not have high blood pressure.    It is important to stay hydrated: drink plenty of fluids (water, gatorade/powerade/pedialyte, juices, or teas) to keep your throat moisturized and help further relieve irritation/discomfort.    Return or go to the Emergency Department if symptoms worsen or do not improve in the next few days

## 2024-10-13 NOTE — ED Provider Notes (Signed)
 " Tristan Thompson    CSN: [id number] Arrival date & time: 10/13/24  1209      History   Chief Complaint Chief Complaint  Patient presents with   Otalgia   Nausea   Diarrhea   Sinus Problem    HPI BENZION MESTA is a 59 y.o. male.   HPI  History obtained from the patient. Tod presents for 3 days of flu-like symptoms.  Has nausea, sinus pressure, bilateral ear pain and diarrhea. Has slight cough with chest congestion.  Tried AlkaSeltzer plus and pepto bismol which helped somewhat. No fever but wakes up sweating.   Had to leave work due to his symptoms. No known sick contacts.      Past Medical History:  Diagnosis Date   Anxiety    Chronic back pain    Hypertension    has a prescription but has not had it filled.   Umbilical hernia    has had surgery to patch it    Patient Active Problem List   Diagnosis Date Noted   GAD (generalized anxiety disorder) 07/12/2024   Left ear pain 04/23/2024   Viral URI with cough 04/23/2024   Smoker 04/23/2024   Other male erectile dysfunction 04/11/2022   Borderline diabetes mellitus 12/31/2021   Hyperlipemia, mixed 12/31/2021   Tobacco dependence 12/28/2021   Alcohol dependence (HCC) 01/21/2017   Anxiety 01/21/2017   Chronic back pain 01/21/2017   Essential hypertension 01/21/2017   Seasonal allergies 01/21/2017    Past Surgical History:  Procedure Laterality Date   ANTERIOR CERVICAL DECOMP/DISCECTOMY FUSION N/A 07/16/2013   Procedure: ANTERIOR CERVICAL DECOMPRESSION/DISCECTOMY FUSION CERVICAL THREE-FOUR,FOUR-FIVE AND CERVICAL SIX-SEVEN;  Surgeon: Darina MALVA Boehringer, MD;  Location: [facility];  Service: Neurosurgery;  Laterality: N/A;   BACK SURGERY     ESOPHAGOGASTRODUODENOSCOPY N/A 01/04/2017   Procedure: ESOPHAGOGASTRODUODENOSCOPY (EGD);  Surgeon: Corinn Jess Brooklyn, MD;  Location: [facility];  Service: Gastroenterology;  Laterality: N/A;   ESOPHAGOGASTRODUODENOSCOPY N/A 06/18/2024   Procedure: EGD  (ESOPHAGOGASTRODUODENOSCOPY);  Surgeon: Toledo, Ladell POUR, MD;  Location: [facility] ENDOSCOPY;  Service: Gastroenterology;  Laterality: N/A;   HERNIA REPAIR         Home Medications    Prior to Admission medications  Medication Sig Start Date End Date Taking? Authorizing Provider  escitalopram (LEXAPRO) 10 MG tablet Take 15 mg by mouth daily.    [provider]  escitalopram (LEXAPRO) 5 MG tablet Take 5 mg by mouth daily. Patient not taking: Reported on 06/18/2024    [provider]  fluticasone  (FLONASE ) 50 MCG/ACT nasal spray Place 2 sprays into both nostrils daily. 07/12/24   Mortenson, Ashley, MD  hydrochlorothiazide (HYDRODIURIL) 25 MG tablet Take 25 mg by mouth daily.    [provider]  hydrochlorothiazide (MICROZIDE) 12.5 MG capsule Take 12.5 mg by mouth daily.    [provider]  ibuprofen  (ADVIL ) 600 MG tablet Take 1 tablet (600 mg total) by mouth every 6 (six) hours as needed. 07/12/24   Van Knee, MD  lidocaine  (LIDODERM ) 5 % Place 1 patch onto the skin daily. Remove & Discard patch within 12 hours or as directed by MD 06/17/24   Ginnie Marich, DO  lovastatin (MEVACOR) 40 MG tablet Take 1 tablet by mouth at bedtime. 01/01/22 04/02/23  [provider]  promethazine -dextromethorphan (PROMETHAZINE -DM) 6.25-15 MG/5ML syrup Take 5 mLs by mouth 4 (four) times daily as needed for cough. 07/12/24   Mortenson, Ashley, MD  tiZANidine (ZANAFLEX) 4 MG tablet 1/2-1 tid prn Patient not  taking: Reported on 06/18/2024 01/27/24   [provider]    Family History Family History  Problem Relation Age of Onset   Arthritis Mother    Hypertension Father     Social History Social History[1]   Allergies   Patient has no known allergies.   Review of Systems Review of Systems: negative unless otherwise stated in HPI.      Physical Exam Triage Vital Signs ED Triage Vitals  Encounter Vitals Group     BP 10/13/24 1255 (!) 158/104      Girls Systolic BP Percentile --      Girls Diastolic BP Percentile --      Boys Systolic BP Percentile --      Boys Diastolic BP Percentile --      Pulse Rate 10/13/24 1255 87     Resp 10/13/24 1255 16     Temp 10/13/24 1255 98.6 F (37 C)     Temp Source 10/13/24 1255 Oral     SpO2 10/13/24 1255 100 %     Weight 10/13/24 1254 212 lb (96.2 kg)     Height --      Head Circumference --      Peak Flow --      Pain Score 10/13/24 1308 0     Pain Loc --      Pain Education --      Exclude from Growth Chart --    No data found.  Updated Vital Signs BP (!) 158/104 (BP Location: Left Arm)   Pulse 87   Temp 98.6 F (37 C) (Oral)   Resp 16   Wt 96.2 kg   SpO2 100%   BMI 27.22 kg/m   Visual Acuity Right Eye Distance:   Left Eye Distance:   Bilateral Distance:    Right Eye Near:   Left Eye Near:    Bilateral Near:     Physical Exam GEN:     alert, non-toxic appearing male in no distress    HENT:  mucus membranes moist, oropharyngeal without lesions, mild erythema, no tonsillar hypertrophy or exudates, clear nasal discharge, left TM normal, right TM erythematous EYES:   pupils equal and reactive, no scleral injection or discharge NECK:  normal ROM, no meningismus   RESP:  no increased work of breathing, clear to auscultation bilaterally CVS:   regular rate and rhythm Skin:   warm and dry    UC Treatments / Results  Labs (all labs ordered are listed, but only abnormal results are displayed) Labs Reviewed - No data to display  EKG   Radiology No results found.   Procedures Procedures (including critical Thompson time)  Medications Ordered in UC Medications - No data to display  Initial Impression / Assessment and Plan / UC Course  I have reviewed the triage vital signs and the nursing notes.  Pertinent labs & imaging results that were available during my Thompson of the patient were reviewed by me and considered in my medical decision making (see chart for  details).       Pt is a 59 y.o. male who presents for *** days of respiratory symptoms. Keelin is afebrile here without recent antipyretics. Zofran  given for nausea. Satting well on room air. Overall pt is ***non-toxic appearing, well hydrated, without respiratory distress. Pulmonary exam ***is unremarkable.  POC COVID and influenza panel obtained ***and was negative. ***POC strep is ***.  Strep throat culture sent.   Suspect ***viral respiratory illness. Discussed symptomatic treatment.  ***Promethazine   DM for cough. ***Atrovent  nasal spray for nasal congestion.  ***Explained lack of efficacy of antibiotics in viral disease.  Typical duration of symptoms discussed. Handout on foods to help with diarrhea given.   Return and ED precautions given and voiced understanding. Discussed MDM, treatment plan and plan for follow-up with patient*** who agrees with plan.     Final Clinical Impressions(s) / UC Diagnoses   Final diagnoses:  None   Discharge Instructions   None    ED Prescriptions   None    PDMP not reviewed this encounter.     [1]  Social History Tobacco Use   Smoking status: Every Day    Current packs/day: 0.50    Types: Cigarettes   Smokeless tobacco: Never  Vaping Use   Vaping status: Never Used  Substance Use Topics   Alcohol use: Yes    Alcohol/week: 6.0 standard drinks of alcohol    Types: 6 Cans of beer per week    Comment: six pack of beer/day   Drug use: No   "
